# Patient Record
Sex: Female | Born: 1965 | Race: White | Hispanic: No | Marital: Married | Smoking: Never smoker
Health system: Southern US, Community
[De-identification: ages and names within clinical notes are randomized; demographics above are authoritative.]

## PROBLEM LIST (undated history)

## (undated) DIAGNOSIS — T7840XA Allergy, unspecified, initial encounter: Secondary | ICD-10-CM

## (undated) DIAGNOSIS — E785 Hyperlipidemia, unspecified: Secondary | ICD-10-CM

## (undated) DIAGNOSIS — M8580 Other specified disorders of bone density and structure, unspecified site: Secondary | ICD-10-CM

## (undated) HISTORY — PX: OOPHORECTOMY: SHX86

## (undated) HISTORY — DX: Hyperlipidemia, unspecified: E78.5

## (undated) HISTORY — DX: Allergy, unspecified, initial encounter: T78.40XA

## (undated) HISTORY — PX: CHOLECYSTECTOMY: SHX55

---

## 2004-08-18 ENCOUNTER — Emergency Department: Payer: Self-pay | Admitting: Internal Medicine

## 2006-01-17 ENCOUNTER — Emergency Department: Payer: Self-pay | Admitting: Emergency Medicine

## 2006-12-30 ENCOUNTER — Inpatient Hospital Stay: Payer: Self-pay | Admitting: Unknown Physician Specialty

## 2006-12-30 HISTORY — PX: ABDOMINAL HYSTERECTOMY: SHX81

## 2007-01-16 ENCOUNTER — Inpatient Hospital Stay: Payer: Self-pay

## 2007-02-04 ENCOUNTER — Ambulatory Visit: Payer: Self-pay | Admitting: Gastroenterology

## 2007-02-12 ENCOUNTER — Ambulatory Visit: Payer: Self-pay | Admitting: Unknown Physician Specialty

## 2007-04-13 ENCOUNTER — Emergency Department: Payer: Self-pay

## 2008-06-14 ENCOUNTER — Ambulatory Visit: Payer: Self-pay | Admitting: Anesthesiology

## 2010-02-27 ENCOUNTER — Ambulatory Visit: Payer: Self-pay | Admitting: Anesthesiology

## 2010-03-25 ENCOUNTER — Emergency Department: Payer: Self-pay | Admitting: Unknown Physician Specialty

## 2010-11-28 ENCOUNTER — Ambulatory Visit: Payer: Self-pay | Admitting: Anesthesiology

## 2011-07-18 ENCOUNTER — Ambulatory Visit: Payer: Self-pay | Admitting: Family Medicine

## 2012-02-19 ENCOUNTER — Ambulatory Visit: Payer: Self-pay | Admitting: Family Medicine

## 2012-09-15 ENCOUNTER — Ambulatory Visit: Payer: Self-pay | Admitting: Family Medicine

## 2013-05-13 ENCOUNTER — Ambulatory Visit: Payer: Self-pay | Admitting: Family Medicine

## 2014-11-08 ENCOUNTER — Encounter: Payer: Self-pay | Admitting: Family Medicine

## 2014-11-08 ENCOUNTER — Ambulatory Visit (INDEPENDENT_AMBULATORY_CARE_PROVIDER_SITE_OTHER): Payer: BLUE CROSS/BLUE SHIELD | Admitting: Family Medicine

## 2014-11-08 VITALS — BP 122/73 | HR 76 | Temp 98.1°F | Resp 18 | Ht 64.0 in | Wt 164.4 lb

## 2014-11-08 DIAGNOSIS — M25511 Pain in right shoulder: Secondary | ICD-10-CM | POA: Insufficient documentation

## 2014-11-08 DIAGNOSIS — E785 Hyperlipidemia, unspecified: Secondary | ICD-10-CM

## 2014-11-08 DIAGNOSIS — Z833 Family history of diabetes mellitus: Secondary | ICD-10-CM

## 2014-11-08 DIAGNOSIS — E782 Mixed hyperlipidemia: Secondary | ICD-10-CM | POA: Insufficient documentation

## 2014-11-08 DIAGNOSIS — N951 Menopausal and female climacteric states: Secondary | ICD-10-CM | POA: Diagnosis not present

## 2014-11-08 MED ORDER — EST ESTROGENS-METHYLTEST HS 0.625-1.25 MG PO TABS: 1.0000 | ORAL_TABLET | Freq: Every day | ORAL | Status: DC

## 2014-11-08 MED ORDER — CRESTOR 10 MG PO TABS: 10.0000 mg | ORAL_TABLET | Freq: Every day | ORAL | Status: DC

## 2014-11-08 MED ORDER — NAPROXEN SODIUM 550 MG PO TABS: 550.0000 mg | ORAL_TABLET | Freq: Two times a day (BID) | ORAL | Status: DC

## 2014-11-08 NOTE — Progress Notes (Signed)
Name: Tina Dunlap   MRN: [id number]    DOB: 03-11-1966   Date:11/08/2014       Progress Note  Subjective  Chief Complaint  Chief Complaint  Patient presents with  . Medication Refill  . Hyperlipidemia    Hyperlipidemia This is a chronic problem. The problem is controlled. Recent lipid tests were reviewed and are normal. Pertinent negatives include no chest pain, leg pain, myalgias or shortness of breath. Current antihyperlipidemic treatment includes statins.  Shoulder Pain  The pain is present in the right shoulder. This is a recurrent (off and on  pain for many years with intermittent flare ups.) problem. The quality of the pain is described as burning. The pain is moderate. Associated symptoms include stiffness. Pertinent negatives include no joint swelling or numbness. The symptoms are aggravated by activity (Lifted heavy boxes yesterday, which led to a flare up. of shoulder pain.). She has tried NSAIDS for the symptoms. The treatment provided moderate relief.  Post-Menopausal Symptoms Pt. Has symptoms of hot flashes, irritability, decrease in libido. She had total hysterectomy in 2008 and is taking hormone replacement therapy since then. She is aware of the potential side effects of HRT.    Past Medical History  Diagnosis Date  . Allergy   . Hyperlipidemia     Past Surgical History  Procedure Laterality Date  . Abdominal hysterectomy  12/30/2006  . Cholecystectomy      Family History  Problem Relation Age of Onset  . Colon polyps Mother   . Diabetes Father   . Alcohol abuse Father     History   Social History  . Marital Status: Married    Spouse Name: N/A  . Number of Children: N/A  . Years of Education: N/A   Occupational History  . Not on file.   Social History Main Topics  . Smoking status: Never Smoker   . Smokeless tobacco: Never Used  . Alcohol Use: No  . Drug Use: No  . Sexual Activity: Not on file   Other Topics Concern  . Not on file   Social  History Narrative  . No narrative on file     Current outpatient prescriptions:  .  cetirizine (ZYRTEC) 10 MG tablet, Take by mouth., Disp: , Rfl:  .  Cholecalciferol (VITAMIN D3) 1000 UNITS CAPS, Take by mouth., Disp: , Rfl:  .  CRESTOR 10 MG tablet, Take 10 mg by mouth daily., Disp: , Rfl: 1 .  EST ESTROGENS-METHYLTEST HS 0.625-1.25 MG per tablet, Take 1 tablet by mouth daily., Disp: , Rfl: 1 .  Glucosamine-Chondroit-Vit C-Mn (GLUCOSAMINE CHONDR 1500 COMPLX) CAPS, Take by mouth., Disp: , Rfl:  .  magnesium gluconate (MAGONATE) 500 MG tablet, Take by mouth., Disp: , Rfl:  .  Multiple Vitamin (MULTI-VITAMINS) TABS, Take by mouth., Disp: , Rfl:   Allergies  Allergen Reactions  . Latex Other (See Comments) and Rash     Review of Systems  Respiratory: Negative for shortness of breath.   Cardiovascular: Negative for chest pain.  Musculoskeletal: Positive for stiffness. Negative for myalgias.  Neurological: Negative for numbness.  Psychiatric/Behavioral: Negative for depression. The patient is not nervous/anxious.       Objective  Filed Vitals:   11/08/14 1415  BP: 122/73  Pulse: 76  Temp: 98.1 F (36.7 C)  TempSrc: Oral  Resp: 18  Height: 5\' 4"  (1.626 m)  Weight: 164 lb 6.4 oz (74.571 kg)  SpO2: 97%    Physical Exam  Constitutional: She is oriented  to person, place, and time and well-developed, well-nourished, and in no distress.  HENT:  Head: Normocephalic and atraumatic.  Cardiovascular: Normal rate and regular rhythm.   Pulmonary/Chest: Effort normal and breath sounds normal.  Abdominal: Soft. Bowel sounds are normal.  Neurological: She is alert and oriented to person, place, and time.  Skin: Skin is warm and dry.  Psychiatric: Memory, affect and judgment normal.  Nursing note and vitals reviewed.    Assessment & Plan 1. Hyperlipidemia Continue present management. Recheck FLP and liver enzymes today. - CRESTOR 10 MG tablet; Take 1 tablet (10 mg total) by  mouth daily.  Dispense: 90 tablet; Refill: 0 - Lipid Profile - Comprehensive metabolic panel  2. Hot flash, menopausal Symptoms are stable and responsive to hormone replacement therapy. Patient is aware of all the side effects and potential complications of HRT and she has had none so far. Refills provided for 3 months and follow-up. - EST ESTROGENS-METHYLTEST HS 0.625-1.25 MG per tablet; Take 1 tablet by mouth daily. Take 1 tab po q day x 3 weeks followed by 1 week off.  Dispense: 90 tablet; Refill: 1  3. Family history of diabetes mellitus in father  - HgB A1c  4. Right shoulder pain Check x-ray of right shoulder to rule out any acute process. Symptoms otherwise suggestive of rotator cuff pathology. Follow-up after review of x-ray and completion of NSAID therapy in 2 weeks. - DG Shoulder Right; Future - naproxen sodium (ANAPROX DS) 550 MG tablet; Take 1 tablet (550 mg total) by mouth 2 (two) times daily with a meal.  Dispense: 20 tablet; Refill: 0   Harlo Fabela Asad A. [facility] 11/08/2014 2:26 PM

## 2014-11-10 LAB — LIPID PANEL
Chol/HDL Ratio: 3.8 ratio units (ref 0.0–4.4)
Cholesterol, Total: 145 mg/dL (ref 100–199)
HDL: 38 mg/dL — ABNORMAL LOW (ref >39)
LDL Calculated: 89 mg/dL (ref 0–99)
Triglycerides: 92 mg/dL (ref 0–149)
VLDL Cholesterol Cal: 18 mg/dL (ref 5–40)

## 2014-11-10 LAB — COMPREHENSIVE METABOLIC PANEL
ALK PHOS: 100 IU/L (ref 39–117)
ALT: 27 IU/L (ref 0–32)
AST: 18 IU/L (ref 0–40)
Albumin/Globulin Ratio: 2 (ref 1.1–2.5)
Albumin: 4.5 g/dL (ref 3.5–5.5)
BILIRUBIN TOTAL: 0.4 mg/dL (ref 0.0–1.2)
BUN/Creatinine Ratio: 32 — ABNORMAL HIGH (ref 9–23)
BUN: 21 mg/dL (ref 6–24)
CO2: 23 mmol/L (ref 18–29)
CREATININE: 0.66 mg/dL (ref 0.57–1.00)
Calcium: 9.3 mg/dL (ref 8.7–10.2)
Chloride: 104 mmol/L (ref 97–108)
GFR calc Af Amer: 118 mL/min/{1.73_m2} (ref >59)
GFR, EST NON AFRICAN AMERICAN: 103 mL/min/{1.73_m2} (ref >59)
Globulin, Total: 2.3 g/dL (ref 1.5–4.5)
Glucose: 107 mg/dL — ABNORMAL HIGH (ref 65–99)
Potassium: 5 mmol/L (ref 3.5–5.2)
Sodium: 141 mmol/L (ref 134–144)
TOTAL PROTEIN: 6.8 g/dL (ref 6.0–8.5)

## 2014-11-10 LAB — HEMOGLOBIN A1C
ESTIMATED AVERAGE GLUCOSE: 114 mg/dL
Hgb A1c MFr Bld: 5.6 % (ref 4.8–5.6)

## 2014-12-30 ENCOUNTER — Encounter: Payer: Self-pay | Admitting: Family Medicine

## 2015-05-11 ENCOUNTER — Encounter: Payer: Self-pay | Admitting: Family Medicine

## 2015-05-11 ENCOUNTER — Ambulatory Visit (INDEPENDENT_AMBULATORY_CARE_PROVIDER_SITE_OTHER): Payer: BLUE CROSS/BLUE SHIELD | Admitting: Family Medicine

## 2015-05-11 VITALS — BP 121/69 | HR 80 | Temp 98.2°F | Resp 19 | Ht 64.0 in | Wt 163.8 lb

## 2015-05-11 DIAGNOSIS — N951 Menopausal and female climacteric states: Secondary | ICD-10-CM

## 2015-05-11 DIAGNOSIS — R739 Hyperglycemia, unspecified: Secondary | ICD-10-CM

## 2015-05-11 DIAGNOSIS — E785 Hyperlipidemia, unspecified: Secondary | ICD-10-CM

## 2015-05-11 LAB — POCT GLYCOSYLATED HEMOGLOBIN (HGB A1C): Hemoglobin A1C: 5.3

## 2015-05-11 MED ORDER — EST ESTROGENS-METHYLTEST HS 0.625-1.25 MG PO TABS: 1.0000 | ORAL_TABLET | Freq: Every day | ORAL | Status: DC

## 2015-05-11 MED ORDER — CRESTOR 10 MG PO TABS: 10.0000 mg | ORAL_TABLET | Freq: Every day | ORAL | Status: DC

## 2015-05-11 NOTE — Progress Notes (Signed)
Name: Tina Dunlap   MRN: [id number]    DOB: March 08, 1966   Date:05/11/2015       Progress Note  Subjective  Chief Complaint  Chief Complaint  Patient presents with  . Follow-up    6 mo  . Medication Refill    crestor 10 mg / estrogens   . Hyperlipidemia    Hyperlipidemia This is a chronic problem. The problem is controlled. Recent lipid tests were reviewed and are normal. Pertinent negatives include no chest pain, leg pain, myalgias or shortness of breath. Current antihyperlipidemic treatment includes statins. The current treatment provides significant improvement of lipids. There are no compliance problems.    Vasomotor Symptoms Pt. Has history of post-menopausal symptoms after total hysterectomy in 2008 for fibroid cysts, endometriosis, and 'hardening of uterine lining'. She is on esterified estrogen therapy isnce then, is aware of the potential risks of HRT (including, but not limited to CVD, DVT, CVA etc).HRT also helps with mood and libido as well. patient requests continuation of therapy  Past Medical History  Diagnosis Date  . Allergy   . Hyperlipidemia     Past Surgical History  Procedure Laterality Date  . Abdominal hysterectomy  12/30/2006  . Cholecystectomy      Family History  Problem Relation Age of Onset  . Colon polyps Mother   . Diabetes Father   . Alcohol abuse Father     Social History   Social History  . Marital Status: Married    Spouse Name: N/A  . Number of Children: N/A  . Years of Education: N/A   Occupational History  . Not on file.   Social History Main Topics  . Smoking status: Never Smoker   . Smokeless tobacco: Never Used  . Alcohol Use: No  . Drug Use: No  . Sexual Activity: Not on file   Other Topics Concern  . Not on file   Social History Narrative     Current outpatient prescriptions:  .  cetirizine (ZYRTEC) 10 MG tablet, Take by mouth., Disp: , Rfl:  .  Cholecalciferol (VITAMIN D3) 1000 UNITS CAPS, Take by mouth., Disp:  , Rfl:  .  CRESTOR 10 MG tablet, Take 1 tablet (10 mg total) by mouth daily., Disp: 90 tablet, Rfl: 0 .  EST ESTROGENS-METHYLTEST HS 0.625-1.25 MG per tablet, Take 1 tablet by mouth daily. Take 1 tab po q day x 3 weeks followed by 1 week off., Disp: 90 tablet, Rfl: 1 .  Glucosamine-Chondroit-Vit C-Mn (GLUCOSAMINE CHONDR 1500 COMPLX) CAPS, Take by mouth., Disp: , Rfl:  .  Multiple Vitamin (MULTI-VITAMINS) TABS, Take by mouth., Disp: , Rfl:  .  naproxen sodium (ANAPROX DS) 550 MG tablet, Take 1 tablet (550 mg total) by mouth 2 (two) times daily with a meal., Disp: 20 tablet, Rfl: 0  Allergies  Allergen Reactions  . Latex Other (See Comments) and Rash     Review of Systems  Constitutional: Negative for fever, chills and weight loss.  Respiratory: Negative for shortness of breath.   Cardiovascular: Negative for chest pain.  Gastrointestinal: Negative for abdominal pain.  Musculoskeletal: Negative for myalgias.     Objective  Filed Vitals:   05/11/15 0808  BP: 121/69  Pulse: 80  Temp: 98.2 F (36.8 C)  TempSrc: Oral  Resp: 19  Height: 5\' 4"  (1.626 m)  Weight: 163 lb 12.8 oz (74.299 kg)  SpO2: 96%    Physical Exam  Constitutional: She is well-developed, well-nourished, and in no distress.  Cardiovascular: Normal  rate and regular rhythm.   Pulmonary/Chest: Effort normal and breath sounds normal.  Abdominal: Soft. Bowel sounds are normal.  Nursing note and vitals reviewed.    Assessment & Plan  1. Hot flash, menopausal  Advised to cycle through 3 weeks, followed by 1 week of. - EST ESTROGENS-METHYLTEST HS 0.625-1.25 MG tablet; Take 1 tablet by mouth daily. Take 1 tab po q day x 3 weeks followed by 1 week off.  Dispense: 90 tablet; Refill: 1  2. Hyperlipidemia  - CRESTOR 10 MG tablet; Take 1 tablet (10 mg total) by mouth daily.  Dispense: 90 tablet; Refill: 0 - Lipid Profile - Comprehensive Metabolic Panel (CMET)  3. Hyperglycemia  - POCT HgB A1C   Danitra Payano Asad A.  [facility] 05/11/2015 8:16 AM

## 2015-05-12 LAB — COMPREHENSIVE METABOLIC PANEL
ALBUMIN: 4.4 g/dL (ref 3.5–5.5)
ALT: 26 IU/L (ref 0–32)
AST: 20 IU/L (ref 0–40)
Albumin/Globulin Ratio: 1.7 (ref 1.1–2.5)
Alkaline Phosphatase: 100 IU/L (ref 39–117)
BUN/Creatinine Ratio: 26 — ABNORMAL HIGH (ref 9–23)
BUN: 18 mg/dL (ref 6–24)
Bilirubin Total: 0.5 mg/dL (ref 0.0–1.2)
CALCIUM: 9.6 mg/dL (ref 8.7–10.2)
CO2: 24 mmol/L (ref 18–29)
CREATININE: 0.7 mg/dL (ref 0.57–1.00)
Chloride: 105 mmol/L (ref 96–106)
GFR calc non Af Amer: 101 mL/min/{1.73_m2} (ref >59)
GFR, EST AFRICAN AMERICAN: 116 mL/min/{1.73_m2} (ref >59)
GLUCOSE: 97 mg/dL (ref 65–99)
Globulin, Total: 2.6 g/dL (ref 1.5–4.5)
Potassium: 4.4 mmol/L (ref 3.5–5.2)
Sodium: 145 mmol/L — ABNORMAL HIGH (ref 134–144)
TOTAL PROTEIN: 7 g/dL (ref 6.0–8.5)

## 2015-05-12 LAB — LIPID PANEL
Chol/HDL Ratio: 3.5 ratio units (ref 0.0–4.4)
Cholesterol, Total: 138 mg/dL (ref 100–199)
HDL: 40 mg/dL (ref >39)
LDL Calculated: 80 mg/dL (ref 0–99)
Triglycerides: 92 mg/dL (ref 0–149)
VLDL Cholesterol Cal: 18 mg/dL (ref 5–40)

## 2015-06-09 ENCOUNTER — Ambulatory Visit (INDEPENDENT_AMBULATORY_CARE_PROVIDER_SITE_OTHER): Payer: BLUE CROSS/BLUE SHIELD | Admitting: Family Medicine

## 2015-06-09 ENCOUNTER — Encounter: Payer: Self-pay | Admitting: Family Medicine

## 2015-06-09 VITALS — BP 122/69 | HR 72 | Temp 97.7°F | Resp 16 | Ht 64.0 in | Wt 164.6 lb

## 2015-06-09 DIAGNOSIS — Z1239 Encounter for other screening for malignant neoplasm of breast: Secondary | ICD-10-CM | POA: Diagnosis not present

## 2015-06-09 DIAGNOSIS — Z0000 Encounter for general adult medical examination without abnormal findings: Secondary | ICD-10-CM | POA: Diagnosis not present

## 2015-06-09 NOTE — Progress Notes (Signed)
Name: Tina Dunlap   MRN: [id number]    DOB: Nov 11, 1965   Date:06/09/2015       Progress Note  Subjective  Chief Complaint  Chief Complaint  Patient presents with  . Annual Exam    CPE    HPI  Pt. Is here for a Complete Physical Exam. He is doing well. Last colonoscopy was in 2008. Last mammogram was June 2014 Pt. Had a total hysterectomy for endometriosis, and fibroids.    Past Medical History  Diagnosis Date  . Allergy   . Hyperlipidemia     Past Surgical History  Procedure Laterality Date  . Abdominal hysterectomy  12/30/2006  . Cholecystectomy      Family History  Problem Relation Age of Onset  . Colon polyps Mother   . Diabetes Father   . Alcohol abuse Father     Social History   Social History  . Marital Status: Married    Spouse Name: N/A  . Number of Children: N/A  . Years of Education: N/A   Occupational History  . Not on file.   Social History Main Topics  . Smoking status: Never Smoker   . Smokeless tobacco: Never Used  . Alcohol Use: No  . Drug Use: No  . Sexual Activity: Not on file   Other Topics Concern  . Not on file   Social History Narrative     Current outpatient prescriptions:  .  cetirizine (ZYRTEC) 10 MG tablet, Take by mouth., Disp: , Rfl:  .  Cholecalciferol (VITAMIN D3) 1000 UNITS CAPS, Take by mouth., Disp: , Rfl:  .  CRESTOR 10 MG tablet, Take 1 tablet (10 mg total) by mouth daily., Disp: 90 tablet, Rfl: 0 .  EST ESTROGENS-METHYLTEST HS 0.625-1.25 MG tablet, Take 1 tablet by mouth daily. Take 1 tab po q day x 3 weeks followed by 1 week off., Disp: 90 tablet, Rfl: 1 .  Glucosamine-Chondroit-Vit C-Mn (GLUCOSAMINE CHONDR 1500 COMPLX) CAPS, Take by mouth., Disp: , Rfl:  .  Multiple Vitamin (MULTI-VITAMINS) TABS, Take by mouth., Disp: , Rfl:  .  naproxen sodium (ANAPROX DS) 550 MG tablet, Take 1 tablet (550 mg total) by mouth 2 (two) times daily with a meal., Disp: 20 tablet, Rfl: 0  Allergies  Allergen Reactions  . Latex  Other (See Comments) and Rash    Review of Systems  Constitutional: Negative for fever, chills, weight loss and malaise/fatigue.  HENT: Negative for congestion and ear pain.   Eyes: Negative for blurred vision.  Respiratory: Negative for cough and shortness of breath.   Cardiovascular: Negative for chest pain.  Gastrointestinal: Positive for heartburn (occasional heartburn.). Negative for nausea, vomiting, diarrhea, constipation, blood in stool and melena.  Genitourinary: Negative for dysuria and hematuria.  Musculoskeletal: Positive for back pain (occasional back pain). Negative for joint pain and neck pain.  Skin: Negative for rash.  Neurological: Negative for dizziness and headaches.  Psychiatric/Behavioral: Negative for depression. The patient is not nervous/anxious (occasional anxiety) and does not have insomnia (occasional).      Objective  Filed Vitals:   06/09/15 0918  BP: 122/69  Pulse: 72  Temp: 97.7 F (36.5 C)  TempSrc: Oral  Resp: 16  Height: 5\' 4"  (1.626 m)  Weight: 164 lb 9.6 oz (74.662 kg)  SpO2: 97%    Physical Exam  Constitutional: She is oriented to person, place, and time and well-developed, well-nourished, and in no distress.  HENT:  Head: Normocephalic and atraumatic. Head is without contusion.  Right Ear: Tympanic membrane and ear canal normal.  Left Ear: Tympanic membrane and ear canal normal.  Mouth/Throat: Oropharynx is clear and moist and mucous membranes are normal.  Cardiovascular: Normal rate and regular rhythm.   Pulmonary/Chest: Effort normal and breath sounds normal.  Abdominal: Soft. Bowel sounds are normal.  Musculoskeletal:       Thoracic back: Normal.       Lumbar back: Normal.  Neurological: She is alert and oriented to person, place, and time.  Skin: Skin is warm and dry.  Psychiatric: Mood, memory, affect and judgment normal.  Nursing note and vitals reviewed.     Assessment & Plan  1. Well woman exam without  gynecological exam Patient will follow up with gynecology for GYN exams  2. Screening for breast cancer  - MM Digital Screening; Future   Rhiannan Kievit Asad A. [facility] 06/09/2015 9:40 AM

## 2015-08-10 ENCOUNTER — Other Ambulatory Visit: Payer: Self-pay | Admitting: Family Medicine

## 2015-11-02 ENCOUNTER — Ambulatory Visit: Payer: BLUE CROSS/BLUE SHIELD | Admitting: Family Medicine

## 2015-11-14 ENCOUNTER — Encounter: Payer: Self-pay | Admitting: Family Medicine

## 2015-11-14 ENCOUNTER — Ambulatory Visit (INDEPENDENT_AMBULATORY_CARE_PROVIDER_SITE_OTHER): Payer: BLUE CROSS/BLUE SHIELD | Admitting: Family Medicine

## 2015-11-14 VITALS — BP 122/75 | HR 73 | Temp 98.4°F | Resp 16 | Ht 64.0 in | Wt 167.5 lb

## 2015-11-14 DIAGNOSIS — N951 Menopausal and female climacteric states: Secondary | ICD-10-CM | POA: Diagnosis not present

## 2015-11-14 DIAGNOSIS — M25572 Pain in left ankle and joints of left foot: Secondary | ICD-10-CM

## 2015-11-14 DIAGNOSIS — E785 Hyperlipidemia, unspecified: Secondary | ICD-10-CM

## 2015-11-14 MED ORDER — EST ESTROGENS-METHYLTEST HS 0.625-1.25 MG PO TABS: 1.0000 | ORAL_TABLET | Freq: Every day | ORAL | 1 refills | Status: DC

## 2015-11-14 MED ORDER — CRESTOR 10 MG PO TABS: 10.0000 mg | ORAL_TABLET | Freq: Every day | ORAL | 0 refills | Status: DC

## 2015-11-14 NOTE — Progress Notes (Signed)
Name: Tina Dunlap   MRN: [id number]    DOB: 1965/11/10   Date:11/14/2015       Progress Note  Subjective  Chief Complaint  Chief Complaint  Patient presents with  . Medication Refill    Hyperlipidemia  This is a chronic problem. The problem is controlled. Recent lipid tests were reviewed and are normal. Exacerbating diseases include obesity. Pertinent negatives include no leg pain, myalgias or shortness of breath. Current antihyperlipidemic treatment includes statins. There are no compliance problems.   Ankle Pain   There was no injury mechanism. The pain is present in the left ankle. The pain is at a severity of 0/10 (pain is recurrent, worse at end of the day). The pain has been fluctuating since onset. Pertinent negatives include no inability to bear weight, loss of motion, numbness or tingling. The symptoms are aggravated by weight bearing. She has tried NSAIDs, heat and elevation for the symptoms. The treatment provided mild relief.   Menopausal Symptoms: Pt. Presents for medication refill and follow up on vasomotor symptoms of menopause. She reports frequent hot flashes, mood swings, and generally irritable before she was started on HRT after hysterectomy  many years ago. She reports relief of vasomotor symptoms while on HRT. No side effects reported.    Past Medical History:  Diagnosis Date  . Allergy   . Hyperlipidemia     Past Surgical History:  Procedure Laterality Date  . ABDOMINAL HYSTERECTOMY  12/30/2006  . CHOLECYSTECTOMY      Family History  Problem Relation Age of Onset  . Colon polyps Mother   . Diabetes Father   . Alcohol abuse Father     Social History   Social History  . Marital status: Married    Spouse name: N/A  . Number of children: N/A  . Years of education: N/A   Occupational History  . Not on file.   Social History Main Topics  . Smoking status: Never Smoker  . Smokeless tobacco: Never Used  . Alcohol use No  . Drug use: No  . Sexual  activity: Yes   Other Topics Concern  . Not on file   Social History Narrative  . No narrative on file     Current Outpatient Prescriptions:  .  cetirizine (ZYRTEC) 10 MG tablet, Take by mouth., Disp: , Rfl:  .  CRESTOR 10 MG tablet, TAKE 1 TABLET (10 MG TOTAL) BY MOUTH DAILY., Disp: 90 tablet, Rfl: 0 .  EST ESTROGENS-METHYLTEST HS 0.625-1.25 MG tablet, Take 1 tablet by mouth daily. Take 1 tab po q day x 3 weeks followed by 1 week off., Disp: 90 tablet, Rfl: 1 .  Multiple Vitamin (MULTI-VITAMINS) TABS, Take by mouth., Disp: , Rfl:  .  naproxen sodium (ANAPROX DS) 550 MG tablet, Take 1 tablet (550 mg total) by mouth 2 (two) times daily with a meal., Disp: 20 tablet, Rfl: 0 .  Cholecalciferol (VITAMIN D3) 1000 UNITS CAPS, Take by mouth., Disp: , Rfl:  .  Glucosamine-Chondroit-Vit C-Mn (GLUCOSAMINE CHONDR 1500 COMPLX) CAPS, Take by mouth., Disp: , Rfl:   Allergies  Allergen Reactions  . Latex Other (See Comments) and Rash    Review of Systems  Respiratory: Negative for shortness of breath.   Musculoskeletal: Positive for joint pain. Negative for myalgias.  Neurological: Negative for tingling and numbness.    Objective  Vitals:   11/14/15 1522  BP: 122/75  Pulse: 73  Resp: 16  Temp: 98.4 F (36.9 C)  TempSrc: Oral  SpO2: 97%  Weight: 167 lb 8 oz (76 kg)  Height: 5\' 4"  (1.626 m)    Physical Exam  Constitutional: She is oriented to person, place, and time and well-developed, well-nourished, and in no distress.  HENT:  Head: Normocephalic and atraumatic.  Cardiovascular: Normal rate, regular rhythm and normal heart sounds.   No murmur heard. Pulmonary/Chest: Effort normal and breath sounds normal. She has no wheezes.  Abdominal: Soft. Bowel sounds are normal. There is no tenderness.  Musculoskeletal:       Left ankle: She exhibits normal range of motion, no swelling and no deformity. No tenderness.  Neurological: She is alert and oriented to person, place, and time.   Psychiatric: Mood, memory, affect and judgment normal.  Nursing note and vitals reviewed.   Assessment & Plan  1. Hot flash, menopausal On long-term HRT, aware of potential adverse effects associated with HRT. Refills provided - EST ESTROGENS-METHYLTEST HS 0.625-1.25 MG tablet; Take 1 tablet by mouth daily. Take 1 tab po q day x 3 weeks followed by 1 week off.  Dispense: 90 tablet; Refill: 1  2. Hyperlipidemia  - CRESTOR 10 MG tablet; Take 1 tablet (10 mg total) by mouth daily.  Dispense: 90 tablet; Refill: 0 - Lipid Profile - COMPLETE METABOLIC PANEL WITH GFR  3. Pain in left ankle  Likely from overuse versus poor body mechanics. Encouraged stretching, may take short-term NSAID therapy.  Scherrie Seneca Asad A. [facility] 11/14/2015 4:15 PM

## 2015-11-16 LAB — LIPID PANEL
CHOL/HDL RATIO: 3.5 ratio (ref <=5.0)
CHOLESTEROL: 125 mg/dL (ref 125–200)
HDL: 36 mg/dL — AB (ref >=46)
LDL CALC: 69 mg/dL (ref <130)
TRIGLYCERIDES: 99 mg/dL (ref <150)
VLDL: 20 mg/dL (ref <30)

## 2015-11-16 LAB — COMPLETE METABOLIC PANEL WITH GFR
ALT: 24 U/L (ref 6–29)
AST: 20 U/L (ref 10–35)
Albumin: 4.3 g/dL (ref 3.6–5.1)
Alkaline Phosphatase: 98 U/L (ref 33–130)
BUN: 22 mg/dL (ref 7–25)
CALCIUM: 9.3 mg/dL (ref 8.6–10.4)
CHLORIDE: 108 mmol/L (ref 98–110)
CO2: 26 mmol/L (ref 20–31)
Creat: 0.62 mg/dL (ref 0.50–1.05)
Glucose, Bld: 103 mg/dL — ABNORMAL HIGH (ref 65–99)
POTASSIUM: 4.7 mmol/L (ref 3.5–5.3)
Sodium: 142 mmol/L (ref 135–146)
Total Bilirubin: 0.5 mg/dL (ref 0.2–1.2)
Total Protein: 6.8 g/dL (ref 6.1–8.1)

## 2015-12-22 ENCOUNTER — Telehealth: Payer: Self-pay | Admitting: Family Medicine

## 2015-12-22 DIAGNOSIS — E785 Hyperlipidemia, unspecified: Secondary | ICD-10-CM

## 2015-12-22 MED ORDER — CRESTOR 10 MG PO TABS: 10.0000 mg | ORAL_TABLET | Freq: Every day | ORAL | 0 refills | Status: DC

## 2015-12-22 NOTE — Telephone Encounter (Signed)
Prescription for Crestor has been sent to patient's pharmacy

## 2016-05-16 ENCOUNTER — Encounter: Payer: Self-pay | Admitting: Family Medicine

## 2016-05-16 ENCOUNTER — Ambulatory Visit (INDEPENDENT_AMBULATORY_CARE_PROVIDER_SITE_OTHER): Payer: BLUE CROSS/BLUE SHIELD | Admitting: Family Medicine

## 2016-05-16 VITALS — BP 121/68 | HR 85 | Temp 98.0°F | Resp 17 | Ht 64.0 in | Wt 165.8 lb

## 2016-05-16 DIAGNOSIS — N951 Menopausal and female climacteric states: Secondary | ICD-10-CM

## 2016-05-16 DIAGNOSIS — Z23 Encounter for immunization: Secondary | ICD-10-CM | POA: Diagnosis not present

## 2016-05-16 DIAGNOSIS — E785 Hyperlipidemia, unspecified: Secondary | ICD-10-CM | POA: Diagnosis not present

## 2016-05-16 DIAGNOSIS — R739 Hyperglycemia, unspecified: Secondary | ICD-10-CM | POA: Diagnosis not present

## 2016-05-16 LAB — POCT GLYCOSYLATED HEMOGLOBIN (HGB A1C): HEMOGLOBIN A1C: 5.6

## 2016-05-16 MED ORDER — EST ESTROGENS-METHYLTEST HS 0.625-1.25 MG PO TABS: 1.0000 | ORAL_TABLET | Freq: Every day | ORAL | 1 refills | Status: DC

## 2016-05-16 NOTE — Progress Notes (Signed)
Name: Tina Dunlap   MRN: [id number]    DOB: [birth year]   Date:05/16/2016       Progress Note  Subjective  Chief Complaint  Chief Complaint  Patient presents with  . Follow-up    4 wks / cholesterol    Hyperlipidemia  This is a chronic problem. The problem is controlled. Recent lipid tests were reviewed and are normal. Pertinent negatives include no leg pain, myalgias or shortness of breath. She is currently on no antihyperlipidemic treatment.    Menopausal Symptoms: Pt. Presents for medication refill and follow up on vasomotor symptoms of menopause. She reports frequent hot flashes, mood swings, and generally irritable before she was started on HRT after hysterectomy  many years ago. She feels well, reports no side effects from the treatment.  Past Medical History:  Diagnosis Date  . Allergy   . Hyperlipidemia     Past Surgical History:  Procedure Laterality Date  . ABDOMINAL HYSTERECTOMY  12/30/2006  . CHOLECYSTECTOMY      Family History  Problem Relation Age of Onset  . Colon polyps Mother   . Diabetes Father   . Alcohol abuse Father     Social History   Social History  . Marital status: Married    Spouse name: N/A  . Number of children: N/A  . Years of education: N/A   Occupational History  . Not on file.   Social History Main Topics  . Smoking status: Never Smoker  . Smokeless tobacco: Never Used  . Alcohol use No  . Drug use: No  . Sexual activity: Yes   Other Topics Concern  . Not on file   Social History Narrative  . No narrative on file     Current Outpatient Prescriptions:  .  cetirizine (ZYRTEC) 10 MG tablet, Take by mouth., Disp: , Rfl:  .  EST ESTROGENS-METHYLTEST HS 0.625-1.25 MG tablet, Take 1 tablet by mouth daily. Take 1 tab po q day x 3 weeks followed by 1 week off., Disp: 90 tablet, Rfl: 1 .  Multiple Vitamin (MULTI-VITAMINS) TABS, Take by mouth., Disp: , Rfl:  .  naproxen sodium (ANAPROX DS) 550 MG tablet, Take 1 tablet (550 mg  total) by mouth 2 (two) times daily with a meal., Disp: 20 tablet, Rfl: 0  Allergies  Allergen Reactions  . Latex Other (See Comments) and Rash     Review of Systems  Respiratory: Negative for shortness of breath.   Musculoskeletal: Negative for myalgias.     Objective  Vitals:   05/16/16 1325  BP: 121/68  Pulse: 85  Resp: 17  Temp: 98 F (36.7 C)  TempSrc: Oral  SpO2: 97%  Weight: 165 lb 12.8 oz (75.2 kg)  Height: 5\' 4"  (1.626 m)    Physical Exam  Constitutional: She is oriented to person, place, and time and well-developed, well-nourished, and in no distress.  HENT:  Head: Normocephalic and atraumatic.  Cardiovascular: Normal rate, regular rhythm and normal heart sounds.   No murmur heard. Pulmonary/Chest: Effort normal and breath sounds normal. She has no wheezes.  Abdominal: Soft. Bowel sounds are normal. There is no tenderness.  Musculoskeletal: She exhibits no edema.  Neurological: She is alert and oriented to person, place, and time.  Nursing note and vitals reviewed.     Assessment & Plan  1. Hot flash, menopausal Continue to take HRT as prescribed with 3 weeks of treatment followed by 1 week off. No reported adverse effects and patient is aware of all  potential long-term side effects. She wishes to continue on present therapy. Refills provided and reassess in 6 month - EST ESTROGENS-METHYLTEST HS 0.625-1.25 MG tablet; Take 1 tablet by mouth daily. Take 1 tab po q day x 3 weeks followed by 1 week off.  Dispense: 90 tablet; Refill: 1  2. Hyperglycemia A1c is 5.6%, considered normal - POCT glycosylated hemoglobin (Hb A1C)  3. Dyslipidemia  - Lipid panel  4. Need for influenza vaccination  - Flu Vaccine QUAD 36+ mos PF IM (Fluarix & Fluzone Quad PF)   Tomma Ehinger Asad A. [facility] 05/16/2016 1:34 PM

## 2016-05-20 LAB — LIPID PANEL
Cholesterol: 260 mg/dL — ABNORMAL HIGH (ref <200)
HDL: 38 mg/dL — AB (ref >50)
LDL CALC: 190 mg/dL — AB (ref <100)
TRIGLYCERIDES: 158 mg/dL — AB (ref <150)
Total CHOL/HDL Ratio: 6.8 Ratio — ABNORMAL HIGH (ref <5.0)
VLDL: 32 mg/dL — AB (ref <30)

## 2016-05-27 ENCOUNTER — Ambulatory Visit (INDEPENDENT_AMBULATORY_CARE_PROVIDER_SITE_OTHER): Payer: BLUE CROSS/BLUE SHIELD | Admitting: Family Medicine

## 2016-05-27 ENCOUNTER — Encounter: Payer: Self-pay | Admitting: Family Medicine

## 2016-05-27 VITALS — BP 128/75 | HR 69 | Temp 97.9°F | Resp 16 | Ht 64.0 in | Wt 165.6 lb

## 2016-05-27 DIAGNOSIS — E782 Mixed hyperlipidemia: Secondary | ICD-10-CM | POA: Diagnosis not present

## 2016-05-27 MED ORDER — ROSUVASTATIN CALCIUM 10 MG PO TABS: 10.0000 mg | ORAL_TABLET | Freq: Every day | ORAL | 1 refills | Status: DC

## 2016-05-27 NOTE — Progress Notes (Signed)
Name: Tina Dunlap   MRN: [id number]    DOB: 05-29-1965   Date:05/27/2016       Progress Note  Subjective  Chief Complaint  Chief Complaint  Patient presents with  . Follow-up    Statin therapy    Hyperlipidemia  This is a chronic problem. The problem is uncontrolled. Recent lipid tests were reviewed and are high. She is currently on no antihyperlipidemic treatment (SHe was on Crestor 10 mg at bedtime but has not taken it in the last 6 months. It was too expensive and she did not fill it.). Risk factors for coronary artery disease include dyslipidemia and family history.    Past Medical History:  Diagnosis Date  . Allergy   . Hyperlipidemia     Past Surgical History:  Procedure Laterality Date  . ABDOMINAL HYSTERECTOMY  12/30/2006  . CHOLECYSTECTOMY      Family History  Problem Relation Age of Onset  . Colon polyps Mother   . Diabetes Father   . Alcohol abuse Father     Social History   Social History  . Marital status: Married    Spouse name: N/A  . Number of children: N/A  . Years of education: N/A   Occupational History  . Not on file.   Social History Main Topics  . Smoking status: Never Smoker  . Smokeless tobacco: Never Used  . Alcohol use No  . Drug use: No  . Sexual activity: Yes   Other Topics Concern  . Not on file   Social History Narrative  . No narrative on file     Current Outpatient Prescriptions:  .  cetirizine (ZYRTEC) 10 MG tablet, Take by mouth., Disp: , Rfl:  .  EST ESTROGENS-METHYLTEST HS 0.625-1.25 MG tablet, Take 1 tablet by mouth daily. Take 1 tab po q day x 3 weeks followed by 1 week off., Disp: 90 tablet, Rfl: 1 .  Multiple Vitamin (MULTI-VITAMINS) TABS, Take by mouth., Disp: , Rfl:  .  naproxen sodium (ANAPROX DS) 550 MG tablet, Take 1 tablet (550 mg total) by mouth 2 (two) times daily with a meal., Disp: 20 tablet, Rfl: 0  Allergies  Allergen Reactions  . Latex Other (See Comments) and Rash     ROS  Please see  history of present illness for complete description of ROS  Objective  Vitals:   05/27/16 1504  BP: 128/75  Pulse: 69  Resp: 16  Temp: 97.9 F (36.6 C)  TempSrc: Oral  SpO2: 97%  Weight: 165 lb 9.6 oz (75.1 kg)  Height: 5\' 4"  (1.626 m)    Physical Exam  Constitutional: She is oriented to person, place, and time and well-developed, well-nourished, and in no distress.  HENT:  Head: Normocephalic and atraumatic.  Cardiovascular: Normal rate, regular rhythm and normal heart sounds.   No murmur heard. Pulmonary/Chest: Effort normal and breath sounds normal. She has no wheezes.  Neurological: She is alert and oriented to person, place, and time.  Psychiatric: Mood, memory, affect and judgment normal.  Nursing note and vitals reviewed.    Recent Results (from the past 2160 hour(s))  POCT glycosylated hemoglobin (Hb A1C)     Status: None   Collection Time: 05/16/16  1:56 PM  Result Value Ref Range   Hemoglobin A1C 5.6   Lipid panel     Status: Abnormal   Collection Time: 05/20/16  8:55 AM  Result Value Ref Range   Cholesterol 260 (H) <200 mg/dL   Triglycerides 158 (H) <  150 mg/dL   HDL 38 (L) >50 mg/dL   Total CHOL/HDL Ratio 6.8 (H) <5.0 Ratio   VLDL 32 (H) <30 mg/dL   LDL Cholesterol 190 (H) <100 mg/dL     Assessment & Plan  1. Mixed hyperlipidemia Patient has not taken rosuvastatin in the past few months, with consequent worsening of lipid profile. Restart on generic rosuvastatin 10 mg and recheck in 3 months. - rosuvastatin (CRESTOR) 10 MG tablet; Take 1 tablet (10 mg total) by mouth daily.  Dispense: 90 tablet; Refill: 1   Tina Dunlap [facility] 05/27/2016 3:40 PM

## 2016-08-26 ENCOUNTER — Encounter: Payer: Self-pay | Admitting: Family Medicine

## 2016-08-26 ENCOUNTER — Ambulatory Visit (INDEPENDENT_AMBULATORY_CARE_PROVIDER_SITE_OTHER): Payer: BLUE CROSS/BLUE SHIELD | Admitting: Family Medicine

## 2016-08-26 VITALS — BP 125/77 | HR 80 | Temp 97.5°F | Resp 16 | Ht 64.0 in | Wt 168.1 lb

## 2016-08-26 DIAGNOSIS — N951 Menopausal and female climacteric states: Secondary | ICD-10-CM

## 2016-08-26 DIAGNOSIS — E782 Mixed hyperlipidemia: Secondary | ICD-10-CM | POA: Diagnosis not present

## 2016-08-26 MED ORDER — EST ESTROGENS-METHYLTEST HS 0.625-1.25 MG PO TABS
1.0000 | ORAL_TABLET | Freq: Every day | ORAL | 1 refills | Status: DC
Start: 2016-08-26 — End: 2017-04-10

## 2016-08-26 NOTE — Progress Notes (Signed)
Name: Tina Dunlap   MRN: [id number]    DOB: 1965-10-05   Date:08/26/2016       Progress Note  Subjective  Chief Complaint  Chief Complaint  Patient presents with  . Follow-up    3 mo    Hyperlipidemia  This is a chronic problem. The problem is uncontrolled. Recent lipid tests were reviewed and are high. She has no history of obesity. Pertinent negatives include no focal weakness, leg pain or shortness of breath. Current antihyperlipidemic treatment includes statins.   Patient takes HRT for hot flashes, night sweats and irritability, feels that the HRT helps with symptoms relief, no side effects reported. She is aware of potential adverse effects.     Past Medical History:  Diagnosis Date  . Allergy   . Hyperlipidemia     Past Surgical History:  Procedure Laterality Date  . ABDOMINAL HYSTERECTOMY  12/30/2006  . CHOLECYSTECTOMY      Family History  Problem Relation Age of Onset  . Colon polyps Mother   . Diabetes Father   . Alcohol abuse Father     Social History   Social History  . Marital status: Married    Spouse name: N/A  . Number of children: N/A  . Years of education: N/A   Occupational History  . Not on file.   Social History Main Topics  . Smoking status: Never Smoker  . Smokeless tobacco: Never Used  . Alcohol use No  . Drug use: No  . Sexual activity: Yes   Other Topics Concern  . Not on file   Social History Narrative  . No narrative on file     Current Outpatient Prescriptions:  .  cetirizine (ZYRTEC) 10 MG tablet, Take by mouth., Disp: , Rfl:  .  EST ESTROGENS-METHYLTEST HS 0.625-1.25 MG tablet, Take 1 tablet by mouth daily. Take 1 tab po q day x 3 weeks followed by 1 week off., Disp: 90 tablet, Rfl: 1 .  Multiple Vitamin (MULTI-VITAMINS) TABS, Take by mouth., Disp: , Rfl:  .  rosuvastatin (CRESTOR) 10 MG tablet, Take 1 tablet (10 mg total) by mouth daily., Disp: 90 tablet, Rfl: 1 .  naproxen sodium (ANAPROX DS) 550 MG tablet, Take 1  tablet (550 mg total) by mouth 2 (two) times daily with a meal. (Patient not taking: Reported on 08/26/2016), Disp: 20 tablet, Rfl: 0  Allergies  Allergen Reactions  . Latex Other (See Comments) and Rash     Review of Systems  Respiratory: Negative for shortness of breath.   Neurological: Negative for focal weakness.      Objective  Vitals:   08/26/16 1448  BP: 125/77  Pulse: 80  Resp: 16  Temp: 97.5 F (36.4 C)  TempSrc: Oral  SpO2: 97%  Weight: 168 lb 1.6 oz (76.2 kg)  Height: 5\' 4"  (1.626 m)    Physical Exam  Constitutional: She is oriented to person, place, and time and well-developed, well-nourished, and in no distress.  HENT:  Head: Normocephalic and atraumatic.  Cardiovascular: Normal rate, regular rhythm and normal heart sounds.   No murmur heard. Pulmonary/Chest: Effort normal and breath sounds normal.  Abdominal: Soft. Bowel sounds are normal. There is no tenderness.  Musculoskeletal: She exhibits edema.       Right ankle: She exhibits swelling.       Left ankle: She exhibits swelling.  Trace bilateral pitting edema and tenderness to palpation over both lower extremities.  Neurological: She is alert and oriented to person, place,  and time.  Nursing note and vitals reviewed.      Assessment & Plan  1. Mixed hyperlipidemia Elevated FLP in February 2018, now back on statin, recheck levels - Lipid panel  2. Hot flash, menopausal Continue to take hormonal replacement therapy, patient aware of all potential cardiovascular and other adverse effects, appears to be doing well - EST ESTROGENS-METHYLTEST HS 0.625-1.25 MG tablet; Take 1 tablet by mouth daily. Take 1 tab po q day x 3 weeks followed by 1 week off.  Dispense: 90 tablet; Refill: 1   Kaiah Hosea Asad A[facility] 08/26/2016 3:10 PM

## 2016-08-28 LAB — LIPID PANEL
CHOLESTEROL: 141 mg/dL (ref <200)
HDL: 41 mg/dL — AB (ref >50)
LDL Cholesterol: 74 mg/dL (ref <100)
Total CHOL/HDL Ratio: 3.4 Ratio (ref <5.0)
Triglycerides: 129 mg/dL (ref <150)
VLDL: 26 mg/dL (ref <30)

## 2016-10-01 ENCOUNTER — Ambulatory Visit (INDEPENDENT_AMBULATORY_CARE_PROVIDER_SITE_OTHER): Payer: BLUE CROSS/BLUE SHIELD | Admitting: Family Medicine

## 2016-10-01 ENCOUNTER — Encounter: Payer: Self-pay | Admitting: Family Medicine

## 2016-10-01 VITALS — BP 126/70 | HR 76 | Temp 97.8°F | Resp 16 | Ht 64.0 in | Wt 169.1 lb

## 2016-10-01 DIAGNOSIS — Z1211 Encounter for screening for malignant neoplasm of colon: Secondary | ICD-10-CM | POA: Diagnosis not present

## 2016-10-01 DIAGNOSIS — Z0000 Encounter for general adult medical examination without abnormal findings: Secondary | ICD-10-CM

## 2016-10-01 DIAGNOSIS — Z1231 Encounter for screening mammogram for malignant neoplasm of breast: Secondary | ICD-10-CM | POA: Diagnosis not present

## 2016-10-01 DIAGNOSIS — Z1159 Encounter for screening for other viral diseases: Secondary | ICD-10-CM | POA: Diagnosis not present

## 2016-10-01 DIAGNOSIS — Z13820 Encounter for screening for osteoporosis: Secondary | ICD-10-CM | POA: Diagnosis not present

## 2016-10-01 DIAGNOSIS — Z1239 Encounter for other screening for malignant neoplasm of breast: Secondary | ICD-10-CM

## 2016-10-01 LAB — CBC WITH DIFFERENTIAL/PLATELET
BASOS ABS: 0 {cells}/uL (ref 0–200)
Basophils Relative: 0 %
EOS ABS: 208 {cells}/uL (ref 15–500)
Eosinophils Relative: 4 %
HCT: 43.4 % (ref 35.0–45.0)
Hemoglobin: 14.5 g/dL (ref 11.7–15.5)
LYMPHS PCT: 22 %
Lymphs Abs: 1144 cells/uL (ref 850–3900)
MCH: 30 pg (ref 27.0–33.0)
MCHC: 33.4 g/dL (ref 32.0–36.0)
MCV: 89.9 fL (ref 80.0–100.0)
MONOS PCT: 7 %
MPV: 10 fL (ref 7.5–12.5)
Monocytes Absolute: 364 cells/uL (ref 200–950)
NEUTROS PCT: 67 %
Neutro Abs: 3484 cells/uL (ref 1500–7800)
PLATELETS: 213 10*3/uL (ref 140–400)
RBC: 4.83 MIL/uL (ref 3.80–5.10)
RDW: 13 % (ref 11.0–15.0)
WBC: 5.2 10*3/uL (ref 3.8–10.8)

## 2016-10-01 LAB — HEPATITIS C ANTIBODY: HCV Ab: NEGATIVE

## 2016-10-01 LAB — TSH: TSH: 1.74 mIU/L

## 2016-10-01 NOTE — Progress Notes (Signed)
Name: Tina Dunlap   MRN: [id number]    DOB: [birth year]   Date:10/01/2016       Progress Note  Subjective  Chief Complaint  Chief Complaint  Patient presents with  . Annual Exam    CPE    HPI  Patint presents for Complete Physical Exam.  She is due for mammogram and DEXA scan. She had a colonoscopy a few years ago by Dr. Dionne Milo, no records available.  She is due for Hep C screening.    Past Medical History:  Diagnosis Date  . Allergy   . Hyperlipidemia     Past Surgical History:  Procedure Laterality Date  . ABDOMINAL HYSTERECTOMY  12/30/2006  . CHOLECYSTECTOMY      Family History  Problem Relation Age of Onset  . Colon polyps Mother   . Diabetes Father   . Alcohol abuse Father     Social History   Social History  . Marital status: Married    Spouse name: N/A  . Number of children: N/A  . Years of education: N/A   Occupational History  . Not on file.   Social History Main Topics  . Smoking status: Never Smoker  . Smokeless tobacco: Never Used  . Alcohol use No  . Drug use: No  . Sexual activity: Yes   Other Topics Concern  . Not on file   Social History Narrative  . No narrative on file     Current Outpatient Prescriptions:  .  cetirizine (ZYRTEC) 10 MG tablet, Take by mouth., Disp: , Rfl:  .  EST ESTROGENS-METHYLTEST HS 0.625-1.25 MG tablet, Take 1 tablet by mouth daily. Take 1 tab po q day x 3 weeks followed by 1 week off., Disp: 90 tablet, Rfl: 1 .  Multiple Vitamin (MULTI-VITAMINS) TABS, Take by mouth., Disp: , Rfl:  .  rosuvastatin (CRESTOR) 10 MG tablet, Take 1 tablet (10 mg total) by mouth daily., Disp: 90 tablet, Rfl: 1  Allergies  Allergen Reactions  . Latex Other (See Comments) and Rash     Review of Systems  Constitutional: Negative for chills, fever, malaise/fatigue and weight loss.  HENT: Negative for congestion and sore throat.   Eyes: Negative for blurred vision and double vision.  Respiratory: Negative for cough,  shortness of breath, wheezing and stridor.   Cardiovascular: Negative for chest pain, palpitations and leg swelling.  Gastrointestinal: Negative for abdominal pain, blood in stool, nausea and vomiting.  Genitourinary: Negative for dysuria and hematuria.  Musculoskeletal: Negative for back pain, joint pain and neck pain.  Neurological: Negative for dizziness and headaches.  Psychiatric/Behavioral: Negative for depression. The patient is not nervous/anxious and does not have insomnia.     Objective  Vitals:   10/01/16 1108  BP: 126/70  Pulse: 76  Resp: 16  Temp: 97.8 F (36.6 C)  TempSrc: Oral  SpO2: 97%  Weight: 169 lb 1.6 oz (76.7 kg)  Height: 5\' 4"  (1.626 m)    Physical Exam  Constitutional: She is oriented to person, place, and time and well-developed, well-nourished, and in no distress.  HENT:  Head: Normocephalic and atraumatic.  Right Ear: External ear normal.  Left Ear: External ear normal.  Mouth/Throat: Oropharynx is clear and moist.  Eyes: Pupils are equal, round, and reactive to light.  Cardiovascular: Normal rate, regular rhythm and normal heart sounds.   No murmur heard. Pulmonary/Chest: Effort normal and breath sounds normal. She has no wheezes. Right breast exhibits no mass and no tenderness. Left breast exhibits  no mass and no tenderness.  Abdominal: Soft. Bowel sounds are normal. There is no tenderness.  Musculoskeletal: She exhibits no edema.  Neurological: She is alert and oriented to person, place, and time.  Psychiatric: Mood, memory, affect and judgment normal.  Nursing note and vitals reviewed.     Assessment & Plan  1. Well woman exam without gynecological exam  - CBC with Differential/Platelet - TSH - VITAMIN D 25 Hydroxy (Vit-D Deficiency, Fractures)  2. Screening for breast cancer  - MM DIGITAL SCREENING BILATERAL; Future  3. Screening for colon cancer  - Cologuard  4. Screening for osteoporosis  - DG Bone Density; Future  5.  Need for hepatitis C screening test  - Hepatitis C antibody   Terisha Losasso Asad A. [facility] 10/01/2016 11:20 AM

## 2016-10-02 LAB — VITAMIN D 25 HYDROXY (VIT D DEFICIENCY, FRACTURES): Vit D, 25-Hydroxy: 36 ng/mL (ref 30–100)

## 2016-10-14 LAB — COLOGUARD: Cologuard: NEGATIVE

## 2016-11-13 ENCOUNTER — Ambulatory Visit: Payer: BLUE CROSS/BLUE SHIELD | Admitting: Family Medicine

## 2016-11-17 ENCOUNTER — Encounter: Payer: Self-pay | Admitting: Family Medicine

## 2016-11-17 ENCOUNTER — Other Ambulatory Visit: Payer: Self-pay | Admitting: Family Medicine

## 2016-11-17 DIAGNOSIS — E782 Mixed hyperlipidemia: Secondary | ICD-10-CM

## 2017-01-02 ENCOUNTER — Ambulatory Visit (INDEPENDENT_AMBULATORY_CARE_PROVIDER_SITE_OTHER): Payer: BLUE CROSS/BLUE SHIELD | Admitting: Family Medicine

## 2017-01-02 ENCOUNTER — Encounter: Payer: Self-pay | Admitting: Family Medicine

## 2017-01-02 VITALS — BP 114/62 | HR 81 | Temp 98.1°F | Resp 14 | Ht 64.0 in | Wt 170.8 lb

## 2017-01-02 DIAGNOSIS — E782 Mixed hyperlipidemia: Secondary | ICD-10-CM | POA: Diagnosis not present

## 2017-01-02 DIAGNOSIS — Z23 Encounter for immunization: Secondary | ICD-10-CM

## 2017-01-02 MED ORDER — ROSUVASTATIN CALCIUM 10 MG PO TABS: 10.0000 mg | ORAL_TABLET | Freq: Every day | ORAL | 0 refills | Status: DC

## 2017-01-02 NOTE — Progress Notes (Signed)
Name: Tina Dunlap   MRN: [id number]    DOB: Nov 20, 1965   Date:01/02/2017       Progress Note  Subjective  Chief Complaint  Chief Complaint  Patient presents with  . Medication Refill    crestor   . Immunizations    Tdap and flu  . Follow-up     3 months     Hyperlipidemia  This is a chronic problem. The problem is controlled. Recent lipid tests were reviewed and are normal. Pertinent negatives include no leg pain, myalgias or shortness of breath. Current antihyperlipidemic treatment includes statins. Risk factors for coronary artery disease include dyslipidemia.     Past Medical History:  Diagnosis Date  . Allergy   . Hyperlipidemia     Past Surgical History:  Procedure Laterality Date  . ABDOMINAL HYSTERECTOMY  12/30/2006  . CHOLECYSTECTOMY      Family History  Problem Relation Age of Onset  . Colon polyps Mother   . Diabetes Father   . Alcohol abuse Father     Social History   Social History  . Marital status: Married    Spouse name: N/A  . Number of children: N/A  . Years of education: N/A   Occupational History  . Not on file.   Social History Main Topics  . Smoking status: Never Smoker  . Smokeless tobacco: Never Used  . Alcohol use No  . Drug use: No  . Sexual activity: Yes   Other Topics Concern  . Not on file   Social History Narrative  . No narrative on file     Current Outpatient Prescriptions:  .  cetirizine (ZYRTEC) 10 MG tablet, Take by mouth., Disp: , Rfl:  .  EST ESTROGENS-METHYLTEST HS 0.625-1.25 MG tablet, Take 1 tablet by mouth daily. Take 1 tab po q day x 3 weeks followed by 1 week off., Disp: 90 tablet, Rfl: 1 .  Multiple Vitamin (MULTI-VITAMINS) TABS, Take by mouth., Disp: , Rfl:  .  rosuvastatin (CRESTOR) 10 MG tablet, TAKE 1 TABLET (10 MG TOTAL) BY MOUTH DAILY., Disp: 90 tablet, Rfl: 0  Allergies  Allergen Reactions  . Latex Other (See Comments) and Rash     Review of Systems  Respiratory: Negative for shortness of  breath.   Musculoskeletal: Negative for myalgias.     Objective  Vitals:   01/02/17 1022  BP: 114/62  Pulse: 81  Resp: 14  Temp: 98.1 F (36.7 C)  TempSrc: Oral  SpO2: 95%  Weight: 170 lb 12.8 oz (77.5 kg)  Height: 5\' 4"  (1.626 m)    Physical Exam  Constitutional: She is oriented to person, place, and time and well-developed, well-nourished, and in no distress.  HENT:  Head: Normocephalic and atraumatic.  Cardiovascular: Normal rate, regular rhythm and normal heart sounds.   No murmur heard. Pulmonary/Chest: Effort normal and breath sounds normal. She has no wheezes.  Neurological: She is alert and oriented to person, place, and time.  Psychiatric: Mood, memory, affect and judgment normal.  Nursing note and vitals reviewed.     Recent Results (from the past 2160 hour(s))  Cologuard     Status: None   Collection Time: 10/08/16 12:00 AM  Result Value Ref Range   Cologuard Negative      Assessment & Plan  1. Needs flu shot  - Flu Vaccine QUAD 6+ mos PF IM (Fluarix Quad PF)  2. Mixed hyperlipidemia FLP at goal from May 2018, refill for rosuvastatin provided - rosuvastatin (CRESTOR) 10  MG tablet; Take 1 tablet (10 mg total) by mouth daily.  Dispense: 90 tablet; Refill: 0    Rowene Suto Asad A. [facility] 01/02/2017 10:59 AM

## 2017-02-17 ENCOUNTER — Other Ambulatory Visit: Payer: Self-pay | Admitting: Family Medicine

## 2017-02-17 DIAGNOSIS — E782 Mixed hyperlipidemia: Secondary | ICD-10-CM

## 2017-04-10 ENCOUNTER — Ambulatory Visit (INDEPENDENT_AMBULATORY_CARE_PROVIDER_SITE_OTHER): Payer: BLUE CROSS/BLUE SHIELD | Admitting: Family Medicine

## 2017-04-10 ENCOUNTER — Encounter: Payer: Self-pay | Admitting: Family Medicine

## 2017-04-10 VITALS — BP 116/72 | HR 63 | Temp 97.9°F | Resp 14 | Ht 64.0 in | Wt 173.3 lb

## 2017-04-10 DIAGNOSIS — R0982 Postnasal drip: Secondary | ICD-10-CM

## 2017-04-10 DIAGNOSIS — E782 Mixed hyperlipidemia: Secondary | ICD-10-CM | POA: Diagnosis not present

## 2017-04-10 DIAGNOSIS — N951 Menopausal and female climacteric states: Secondary | ICD-10-CM

## 2017-04-10 LAB — LIPID PANEL
CHOL/HDL RATIO: 3.7 (calc) (ref <5.0)
Cholesterol: 136 mg/dL (ref <200)
HDL: 37 mg/dL — ABNORMAL LOW (ref >50)
LDL CHOLESTEROL (CALC): 79 mg/dL
Non-HDL Cholesterol (Calc): 99 mg/dL (calc) (ref <130)
TRIGLYCERIDES: 113 mg/dL (ref <150)

## 2017-04-10 MED ORDER — EST ESTROGENS-METHYLTEST HS 0.625-1.25 MG PO TABS: 1.0000 | ORAL_TABLET | Freq: Every day | ORAL | 1 refills | Status: DC

## 2017-04-10 MED ORDER — ROSUVASTATIN CALCIUM 10 MG PO TABS: 10.0000 mg | ORAL_TABLET | Freq: Every day | ORAL | 0 refills | Status: DC

## 2017-04-10 MED ORDER — FLUTICASONE PROPIONATE 50 MCG/ACT NA SUSP: 2.0000 | Freq: Every day | NASAL | 0 refills | Status: DC

## 2017-04-10 NOTE — Progress Notes (Signed)
Name: Tina Dunlap   MRN: [id number]    DOB: 02/27/1966   Date:04/10/2017       Progress Note  Subjective  Chief Complaint  Chief Complaint  Patient presents with  . Hyperlipidemia  . Diarrhea    last episode was 45 minutes ago; started hour a half ago. Pt denies any other symptoms  . Nasal Congestion    running nose   . head pressure    Some in the middle of her head  . Medication Refill    Hyperlipidemia  This is a chronic problem. The problem is controlled. Recent lipid tests were reviewed and are normal. Pertinent negatives include no leg pain or shortness of breath. Current antihyperlipidemic treatment includes statins.  Sinus Problem  This is a new problem. The current episode started in the past 7 days. There has been no fever. Associated symptoms include congestion. Pertinent negatives include no ear pain, headaches, shortness of breath, sinus pressure or sore throat (drainage in the throat in the morning, then gets better throughout the day). Past treatments include oral decongestants (Mucinex).   Pt. Takes HRT for menopausal symptoms, including sweating, feeling flushed, night sweats in the past and anxiety. She takes HRT one tablet daily for 3 weeks followed by one week off. She has no history of cardiopulmonary or liver disease, otherwise doing well.   Past Medical History:  Diagnosis Date  . Allergy   . Hyperlipidemia     Past Surgical History:  Procedure Laterality Date  . ABDOMINAL HYSTERECTOMY  12/30/2006  . CHOLECYSTECTOMY      Family History  Problem Relation Age of Onset  . Colon polyps Mother   . Diabetes Father   . Alcohol abuse Father     Social History   Socioeconomic History  . Marital status: Married    Spouse name: Not on file  . Number of children: Not on file  . Years of education: Not on file  . Highest education level: Not on file  Social Needs  . Financial resource strain: Not on file  . Food insecurity - worry: Not on file  . Food  insecurity - inability: Not on file  . Transportation needs - medical: Not on file  . Transportation needs - non-medical: Not on file  Occupational History  . Not on file  Tobacco Use  . Smoking status: Never Smoker  . Smokeless tobacco: Never Used  Substance and Sexual Activity  . Alcohol use: No    Alcohol/week: 0.0 oz  . Drug use: No  . Sexual activity: Yes  Other Topics Concern  . Not on file  Social History Narrative  . Not on file     Current Outpatient Medications:  .  cetirizine (ZYRTEC) 10 MG tablet, Take by mouth., Disp: , Rfl:  .  EST ESTROGENS-METHYLTEST HS 0.625-1.25 MG tablet, Take 1 tablet by mouth daily. Take 1 tab po q day x 3 weeks followed by 1 week off., Disp: 90 tablet, Rfl: 1 .  Multiple Vitamin (MULTI-VITAMINS) TABS, Take by mouth., Disp: , Rfl:  .  rosuvastatin (CRESTOR) 10 MG tablet, Take 1 tablet (10 mg total) by mouth daily., Disp: 90 tablet, Rfl: 0  Allergies  Allergen Reactions  . Latex Other (See Comments) and Rash     Review of Systems  HENT: Positive for congestion. Negative for ear pain, sinus pressure and sore throat (drainage in the throat in the morning, then gets better throughout the day).   Respiratory: Negative for shortness  of breath.   Neurological: Negative for headaches.      Objective  Vitals:   04/10/17 1006  BP: 116/72  Pulse: 63  Resp: 14  Temp: 97.9 F (36.6 C)  TempSrc: Oral  SpO2: 94%  Weight: 173 lb 4.8 oz (78.6 kg)  Height: 5\' 4"  (1.626 m)    Physical Exam  Constitutional: She is oriented to person, place, and time and well-developed, well-nourished, and in no distress.  HENT:  Head: Normocephalic and atraumatic.  Right Ear: Tympanic membrane and ear canal normal.  Left Ear: Tympanic membrane and ear canal normal.  Nose: Right sinus exhibits maxillary sinus tenderness. Left sinus exhibits maxillary sinus tenderness.  Mouth/Throat: Posterior oropharyngeal erythema present.  Nasal mucosal inflammation,  turbinates hypertrophied.   Cardiovascular: Normal rate, regular rhythm and normal heart sounds.  No murmur heard. Pulmonary/Chest: Effort normal and breath sounds normal. She has no wheezes.  Musculoskeletal: She exhibits no edema.  Neurological: She is alert and oriented to person, place, and time.  Psychiatric: Mood, memory, affect and judgment normal.  Nursing note and vitals reviewed.      Assessment & Plan  1. Hot flash, menopausal Continue on HRT, patient aware of risks with long-term HRT, does not wish to discontinue at this time, will discuss with next PCP - EST ESTROGENS-METHYLTEST HS 0.625-1.25 MG tablet; Take 1 tablet by mouth daily. Take 1 tab po q day x 3 weeks followed by 1 week off.  Dispense: 90 tablet; Refill: 1  2. Mixed hyperlipidemia  - rosuvastatin (CRESTOR) 10 MG tablet; Take 1 tablet (10 mg total) by mouth daily.  Dispense: 90 tablet; Refill: 0 - Lipid panel  3. Post-nasal drainage Symptoms likely because of seasonal allergies, if she does not improve on Flonase, will consider antibiotic - fluticasone (FLONASE) 50 MCG/ACT nasal spray; Place 2 sprays into both nostrils daily.  Dispense: 16 g; Refill: 0  Keyshon Stein Asad A[facility] 04/10/2017 10:23 AM

## 2017-05-05 ENCOUNTER — Other Ambulatory Visit: Payer: Self-pay

## 2017-05-05 DIAGNOSIS — R0982 Postnasal drip: Secondary | ICD-10-CM

## 2017-05-05 MED ORDER — FLUTICASONE PROPIONATE 50 MCG/ACT NA SUSP: 2.0000 | Freq: Every day | NASAL | 0 refills | Status: DC

## 2017-05-17 ENCOUNTER — Other Ambulatory Visit: Payer: Self-pay | Admitting: Family Medicine

## 2017-05-17 DIAGNOSIS — E782 Mixed hyperlipidemia: Secondary | ICD-10-CM

## 2017-06-06 ENCOUNTER — Other Ambulatory Visit: Payer: Self-pay

## 2017-06-06 DIAGNOSIS — R0982 Postnasal drip: Secondary | ICD-10-CM

## 2017-06-06 MED ORDER — FLUTICASONE PROPIONATE 50 MCG/ACT NA SUSP: 2.0000 | Freq: Every day | NASAL | 10 refills | Status: DC

## 2017-07-10 ENCOUNTER — Ambulatory Visit: Payer: BLUE CROSS/BLUE SHIELD | Admitting: Family Medicine

## 2017-07-18 ENCOUNTER — Encounter: Payer: Self-pay | Admitting: Family Medicine

## 2017-07-18 ENCOUNTER — Ambulatory Visit (INDEPENDENT_AMBULATORY_CARE_PROVIDER_SITE_OTHER): Payer: BLUE CROSS/BLUE SHIELD | Admitting: Family Medicine

## 2017-07-18 VITALS — BP 102/60 | HR 81 | Resp 16 | Ht 64.0 in | Wt 174.4 lb

## 2017-07-18 DIAGNOSIS — R635 Abnormal weight gain: Secondary | ICD-10-CM | POA: Diagnosis not present

## 2017-07-18 DIAGNOSIS — Z1231 Encounter for screening mammogram for malignant neoplasm of breast: Secondary | ICD-10-CM | POA: Diagnosis not present

## 2017-07-18 DIAGNOSIS — E28319 Asymptomatic premature menopause: Secondary | ICD-10-CM | POA: Diagnosis not present

## 2017-07-18 DIAGNOSIS — Z5181 Encounter for therapeutic drug level monitoring: Secondary | ICD-10-CM | POA: Diagnosis not present

## 2017-07-18 DIAGNOSIS — E782 Mixed hyperlipidemia: Secondary | ICD-10-CM

## 2017-07-18 DIAGNOSIS — N951 Menopausal and female climacteric states: Secondary | ICD-10-CM | POA: Diagnosis not present

## 2017-07-18 DIAGNOSIS — R739 Hyperglycemia, unspecified: Secondary | ICD-10-CM

## 2017-07-18 DIAGNOSIS — Z1239 Encounter for other screening for malignant neoplasm of breast: Secondary | ICD-10-CM

## 2017-07-18 MED ORDER — ROSUVASTATIN CALCIUM 10 MG PO TABS: 10.0000 mg | ORAL_TABLET | Freq: Every day | ORAL | 1 refills | Status: DC

## 2017-07-18 MED ORDER — ESTROGENS CONJUGATED 0.625 MG PO TABS: 0.6250 mg | ORAL_TABLET | Freq: Every day | ORAL | 0 refills | Status: DC

## 2017-07-18 MED ORDER — VENLAFAXINE HCL ER 37.5 MG PO CP24: 37.5000 mg | ORAL_CAPSULE | Freq: Every day | ORAL | 3 refills | Status: DC

## 2017-07-18 NOTE — Assessment & Plan Note (Signed)
Order mammo 

## 2017-07-18 NOTE — Patient Instructions (Addendum)
Please do call to schedule your mammogram and DEXA; the number to schedule one at either [facility] or [facility] is [phone] Try to limit saturated fats in your diet (bologna, hot dogs, barbeque, cheeseburgers, hamburgers, steak, bacon, sausage, cheese, etc.) and get more fresh fruits, vegetables, and whole grains Start the effexor for hot flashes Check out the information at familydoctor.org entitled "Nutrition for Weight Loss: What You Need to Know about Fad Diets" Try to lose between 1-2 pounds per week by taking in fewer calories and burning off more calories You can succeed by limiting portions, limiting foods dense in calories and fat, becoming more active, and drinking 8 glasses of water a day (64 ounces) Don't skip meals, especially breakfast, as skipping meals may alter your metabolism Do not use over-the-counter weight loss pills or gimmicks that claim rapid weight loss A healthy BMI (or body mass index) is between 18.5 and 24.9 You can calculate your ideal BMI at the [place] website [employer]fr

## 2017-07-18 NOTE — Assessment & Plan Note (Signed)
Check A1c; weight loss encouraged 

## 2017-07-18 NOTE — Assessment & Plan Note (Signed)
Try effexor at low dose for hot flashes; mammogram needed and then will taper the HRT

## 2017-07-18 NOTE — Assessment & Plan Note (Signed)
Check liver and kidneys 

## 2017-07-18 NOTE — Assessment & Plan Note (Signed)
Will get DEXA

## 2017-07-18 NOTE — Progress Notes (Signed)
BP 102/60 (BP Location: Left Arm, Patient Position: Sitting, Cuff Size: Normal)   Pulse 81   Resp 16   Ht 5\' 4"  (1.626 m)   Wt 174 lb 6.4 oz (79.1 kg)   SpO2 98%   BMI 29.94 kg/m    Subjective:    Patient ID: Tina Dunlap, female    DOB: 05/30/1965, 52 y.o.   MRN: [id number]  HPI: Tina Dunlap is a 52 y.o. female  Chief Complaint  Patient presents with  . Follow-up    patient has no concerns    HPI Patient is new to me; previous provider left our practice Her last well woman exam was October 01, 2016  She has hot flashes and is post-menopausal; she had a hysterectomy years back, maybe 10 years; she is on HRT, but has not had a mammogram since 2013; she has mood and irritability and issues; no lumps or bumps at all in her breasts  She has high cholesterol; she takes crestor; she also has testosterone in her HRT Grandmother had a stroke No personal hx of heart attack or stroke Not really limiting her diet; might eat one egg a week; does eat cheese; drinks milk rarely, 2%; could switch to skim or almond milk HDL is low; no exercise; not interested; gaining weight  High glucose; father had diabetes, died as a result; brother has diabetes, hasn't spoken to him lately; had change his diet and 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; truck driver  DEXA scan overdue; being done for early menopause  Lab Results  Component Value Date   CHOL 136 04/10/2017   CHOL 141 08/26/2016   CHOL 260 (H) 05/20/2016   Lab Results  Component Value Date   HDL 37 (L) 04/10/2017   HDL 41 (L) 08/26/2016   HDL 38 (L) 05/20/2016   Lab Results  Component Value Date   LDLCALC 79 04/10/2017   LDLCALC 74 08/26/2016   LDLCALC 190 (H) 05/20/2016   Lab Results  Component Value Date   TRIG 113 04/10/2017   TRIG 129 08/26/2016   TRIG 158 (H) 05/20/2016   Lab Results  Component Value Date   CHOLHDL 3.7 04/10/2017   CHOLHDL 3.4 08/26/2016   CHOLHDL 6.8 (H) 05/20/2016   No results found for:  LDLDIRECT    Depression screen [facility] 2/9 04/10/2017 01/02/2017 10/01/2016 08/26/2016 05/27/2016  Decreased Interest 0 0 0 0 0  Down, Depressed, Hopeless 0 0 0 0 0  PHQ - 2 Score 0 0 0 0 0    Relevant past medical, surgical, family and social history reviewed Past Medical History:  Diagnosis Date  . Allergy   . Hyperlipidemia    Past Surgical History:  Procedure Laterality Date  . ABDOMINAL HYSTERECTOMY  12/30/2006  . CHOLECYSTECTOMY     Family History  Problem Relation Age of Onset  . Colon polyps Mother   . Diabetes Father   . Alcohol abuse Father    Social History   Tobacco Use  . Smoking status: Never Smoker  . Smokeless tobacco: Never Used  Substance Use Topics  . Alcohol use: No    Alcohol/week: 0.0 oz  . Drug use: No    Interim medical history since last visit reviewed. Allergies and medications reviewed  Review of Systems Per HPI unless specifically indicated above     Objective:    BP 102/60 (BP Location: Left Arm, Patient Position: Sitting, Cuff Size: Normal)   Pulse 81   Resp 16   Ht  5\' 4"  (1.626 m)   Wt 174 lb 6.4 oz (79.1 kg)   SpO2 98%   BMI 29.94 kg/m   Wt Readings from Last 3 Encounters:  07/18/17 174 lb 6.4 oz (79.1 kg)  04/10/17 173 lb 4.8 oz (78.6 kg)  01/02/17 170 lb 12.8 oz (77.5 kg)    Physical Exam  Constitutional: She appears well-developed and well-nourished.  HENT:  Mouth/Throat: Mucous membranes are normal.  Eyes: EOM are normal. No scleral icterus.  Cardiovascular: Normal rate and regular rhythm.  Pulmonary/Chest: Effort normal and breath sounds normal.  Musculoskeletal: She exhibits no edema.  Skin: She is not diaphoretic. No pallor.  Psychiatric: Her behavior is normal. Her affect is blunt.  Rather flat affect; good eye contact with examiner; very little spontaneous movement    Results for orders placed or performed in visit on 04/10/17  Lipid panel  Result Value Ref Range   Cholesterol 136 <200 mg/dL   HDL 37 (L) >50  mg/dL   Triglycerides 113 <150 mg/dL   LDL Cholesterol (Calc) 79 mg/dL (calc)   Total CHOL/HDL Ratio 3.7 <5.0 (calc)   Non-HDL Cholesterol (Calc) 99 <130 mg/dL (calc)      Assessment & Plan:   Problem List Items Addressed This Visit      Other   Screening for breast cancer    Order mammo      Relevant Orders   MM DIGITAL SCREENING BILATERAL   Medication monitoring encounter    Check liver and kidneys      Relevant Orders   COMPLETE METABOLIC PANEL WITH GFR   Hyperlipidemia    Check lipid panel; will try to limit diet; see AVS; continue crestor; avoid the testosterone      Relevant Medications   rosuvastatin (CRESTOR) 10 MG tablet   Other Relevant Orders   Lipid panel   Hyperglycemia    Check A1c; weight loss encouraged      Relevant Orders   Hemoglobin A1c   Hot flash, menopausal - Primary    Try effexor at low dose for hot flashes; mammogram needed and then will taper the HRT      Early onset menopause    Will get DEXA      Relevant Orders   DG Bone Density    Other Visit Diagnoses    Weight gain       Relevant Orders   TSH       Follow up plan: Return in about 2 months (around 10/01/2017) for complete physical.  An after-visit summary was printed and given to the patient at [facility].  Please see the patient instructions which may contain other information and recommendations beyond what is mentioned above in the assessment and plan.  Meds ordered this encounter  Medications  . rosuvastatin (CRESTOR) 10 MG tablet    Sig: Take 1 tablet (10 mg total) by mouth at bedtime.    Dispense:  90 tablet    Refill:  1  . venlafaxine XR (EFFEXOR XR) 37.5 MG 24 hr capsule    Sig: Take 1 capsule (37.5 mg total) by mouth daily with breakfast.    Dispense:  90 capsule    Refill:  3  . estrogens, conjugated, (PREMARIN) 0.625 MG tablet    Sig: Take 1 tablet (0.625 mg total) by mouth daily.    Dispense:  14 tablet    Refill:  0    Orders Placed This Encounter   Procedures  . DG Bone Density  . MM DIGITAL  SCREENING BILATERAL  . Hemoglobin A1c  . COMPLETE METABOLIC PANEL WITH GFR  . Lipid panel  . TSH

## 2017-07-18 NOTE — Assessment & Plan Note (Addendum)
Check lipid panel; will try to limit diet; see AVS; continue crestor; avoid the testosterone

## 2017-07-19 LAB — COMPLETE METABOLIC PANEL WITH GFR
AG Ratio: 1.7 (calc) (ref 1.0–2.5)
ALBUMIN MSPROF: 4.3 g/dL (ref 3.6–5.1)
ALKALINE PHOSPHATASE (APISO): 99 U/L (ref 33–130)
ALT: 23 U/L (ref 6–29)
AST: 18 U/L (ref 10–35)
BUN: 16 mg/dL (ref 7–25)
CO2: 29 mmol/L (ref 20–32)
CREATININE: 0.64 mg/dL (ref 0.50–1.05)
Calcium: 9.7 mg/dL (ref 8.6–10.4)
Chloride: 106 mmol/L (ref 98–110)
GFR, Est African American: 118 mL/min/{1.73_m2} (ref >=60)
GFR, Est Non African American: 102 mL/min/{1.73_m2} (ref >=60)
GLOBULIN: 2.6 g/dL (ref 1.9–3.7)
Glucose, Bld: 98 mg/dL (ref 65–139)
Potassium: 4.5 mmol/L (ref 3.5–5.3)
SODIUM: 141 mmol/L (ref 135–146)
Total Bilirubin: 0.5 mg/dL (ref 0.2–1.2)
Total Protein: 6.9 g/dL (ref 6.1–8.1)

## 2017-07-19 LAB — LIPID PANEL
CHOL/HDL RATIO: 3.4 (calc) (ref <5.0)
CHOLESTEROL: 131 mg/dL (ref <200)
HDL: 38 mg/dL — ABNORMAL LOW (ref >50)
LDL Cholesterol (Calc): 73 mg/dL (calc)
NON-HDL CHOLESTEROL (CALC): 93 mg/dL (ref <130)
Triglycerides: 115 mg/dL (ref <150)

## 2017-07-19 LAB — HEMOGLOBIN A1C
HEMOGLOBIN A1C: 5.5 %{Hb} (ref <5.7)
Mean Plasma Glucose: 111 (calc)
eAG (mmol/L): 6.2 (calc)

## 2017-07-19 LAB — TSH: TSH: 1.25 mIU/L

## 2017-08-19 ENCOUNTER — Ambulatory Visit
Admission: RE | Admit: 2017-08-19 | Discharge: 2017-08-19 | Disposition: A | Discharge status: Home / Self Care | Payer: BLUE CROSS/BLUE SHIELD | Source: Ambulatory Visit | Attending: Family Medicine | Admitting: Family Medicine

## 2017-08-19 ENCOUNTER — Encounter: Payer: Self-pay | Admitting: Family Medicine

## 2017-08-19 DIAGNOSIS — E28319 Asymptomatic premature menopause: Secondary | ICD-10-CM

## 2017-08-19 DIAGNOSIS — M8588 Other specified disorders of bone density and structure, other site: Secondary | ICD-10-CM | POA: Diagnosis not present

## 2017-08-19 DIAGNOSIS — M8580 Other specified disorders of bone density and structure, unspecified site: Secondary | ICD-10-CM | POA: Insufficient documentation

## 2017-08-19 DIAGNOSIS — Z1231 Encounter for screening mammogram for malignant neoplasm of breast: Secondary | ICD-10-CM | POA: Insufficient documentation

## 2017-08-19 DIAGNOSIS — Z1239 Encounter for other screening for malignant neoplasm of breast: Secondary | ICD-10-CM

## 2017-08-19 HISTORY — DX: Other specified disorders of bone density and structure, unspecified site: M85.80

## 2017-10-02 ENCOUNTER — Encounter: Payer: Self-pay | Admitting: Family Medicine

## 2017-10-02 ENCOUNTER — Ambulatory Visit (INDEPENDENT_AMBULATORY_CARE_PROVIDER_SITE_OTHER): Payer: BLUE CROSS/BLUE SHIELD | Admitting: Family Medicine

## 2017-10-02 DIAGNOSIS — Z0000 Encounter for general adult medical examination without abnormal findings: Secondary | ICD-10-CM

## 2017-10-02 NOTE — Assessment & Plan Note (Signed)
USPSTF grade A and B recommendations reviewed with patient; age-appropriate recommendations, preventive care, screening tests, etc discussed and encouraged; healthy living encouraged; see AVS for patient education given to patient Will request records from her pap smear and hysterectomy from 12 years ago, if available

## 2017-10-02 NOTE — Patient Instructions (Addendum)
FRONT STAFF:  Request records from the hospital regarding pap smears and hysterectomy and pathology reports from 2007 and 2008 and 2009, everything related to hysterectomy or abnormal pap smears or follow-up  Check out the information at [email or web address] entitled "Nutrition for Weight Loss: What You Need to Know about Fad Diets" Try to lose between 1-2 pounds per week by taking in fewer calories and burning off more calories You can succeed by limiting portions, limiting foods dense in calories and fat, becoming more active, and drinking 8 glasses of water a day (64 ounces) Don't skip meals, especially breakfast, as skipping meals may alter your metabolism Do not use over-the-counter weight loss pills or gimmicks that claim rapid weight loss A healthy BMI (or body mass index) is between 18.5 and 24.9 You can calculate your ideal BMI at the [place] website ClubMonetize.fr   Consider getting the new shingles vaccine called Shingrix; that is available for individuals 54 years of age and older, and is recommended even if you have had shingles in the past and/or already received the old shingles vaccine (Zostavax); it is a two-part series, and is available at many local pharmacies   Health Maintenance, Female Adopting a healthy lifestyle and getting preventive care can go a long way to promote health and wellness. Talk with your health care provider about what schedule of regular examinations is right for you. This is a good chance for you to check in with your provider about disease prevention and staying healthy. In between checkups, there are plenty of things you can do on your own. Experts have done a lot of research about which lifestyle changes and preventive measures are most likely to keep you healthy. Ask your health care provider for more information. Weight and diet Eat a healthy diet  Be sure to include plenty of vegetables, fruits, low-fat  dairy products, and lean protein.  Do not eat a lot of foods high in solid fats, added sugars, or salt.  Get regular exercise. This is one of the most important things you can do for your health. ? Most adults should exercise for at least 150 minutes each week. The exercise should increase your heart rate and make you sweat (moderate-intensity exercise). ? Most adults should also do strengthening exercises at least twice a week. This is in addition to the moderate-intensity exercise.  Maintain a healthy weight  Body mass index (BMI) is a measurement that can be used to identify possible weight problems. It estimates body fat based on height and weight. Your health care provider can help determine your BMI and help you achieve or maintain a healthy weight.  For females 105 years of age and older: ? A BMI below 18.5 is considered underweight. ? A BMI of 18.5 to 24.9 is normal. ? A BMI of 25 to 29.9 is considered overweight. ? A BMI of 30 and above is considered obese.  Watch levels of cholesterol and blood lipids  You should start having your blood tested for lipids and cholesterol at 54 years of age, then have this test every 5 years.  You may need to have your cholesterol levels checked more often if: ? Your lipid or cholesterol levels are high. ? You are older than 54 years of age. ? You are at high risk for heart disease.  Cancer screening Lung Cancer  Lung cancer screening is recommended for adults 54-66 years old who are at high risk for lung cancer because of a history of smoking.  A  yearly low-dose CT scan of the lungs is recommended for people who: ? Currently smoke. ? Have quit within the past 15 years. ? Have at least a 30-pack-year history of smoking. A pack year is smoking an average of one pack of cigarettes a day for 1 year.  Yearly screening should continue until it has been 15 years since you quit.  Yearly screening should stop if you develop a health problem that  would prevent you from having lung cancer treatment.  Breast Cancer  Practice breast self-awareness. This means understanding how your breasts normally appear and feel.  It also means doing regular breast self-exams. Let your health care provider know about any changes, no matter how small.  If you are in your 54 or 30s, you should have a clinical breast exam (CBE) by a health care provider every 1-3 years as part of a regular health exam.  If you are 54 or older, have a CBE every year. Also consider having a breast X-ray (mammogram) every year.  If you have a family history of breast cancer, talk to your health care provider about genetic screening.  If you are at high risk for breast cancer, talk to your health care provider about having an MRI and a mammogram every year.  Breast cancer gene (BRCA) assessment is recommended for women who have family members with BRCA-related cancers. BRCA-related cancers include: ? Breast. ? Ovarian. ? Tubal. ? Peritoneal cancers.  Results of the assessment will determine the need for genetic counseling and BRCA1 and BRCA2 testing.  Cervical Cancer Your health care provider may recommend that you be screened regularly for cancer of the pelvic organs (ovaries, uterus, and vagina). This screening involves a pelvic examination, including checking for microscopic changes to the surface of your cervix (Pap test). You may be encouraged to have this screening done every 3 years, beginning at age 54.  For women ages 54-65, health care providers may recommend pelvic exams and Pap testing every 3 years, or they may recommend the Pap and pelvic exam, combined with testing for human papilloma virus (HPV), every 5 years. Some types of HPV increase your risk of cervical cancer. Testing for HPV may also be done on women of any age with unclear Pap test results.  Other health care providers may not recommend any screening for nonpregnant women who are considered low  risk for pelvic cancer and who do not have symptoms. Ask your health care provider if a screening pelvic exam is right for you.  If you have had past treatment for cervical cancer or a condition that could lead to cancer, you need Pap tests and screening for cancer for at least 20 years after your treatment. If Pap tests have been discontinued, your risk factors (such as having a new sexual partner) need to be reassessed to determine if screening should resume. Some women have medical problems that increase the chance of getting cervical cancer. In these cases, your health care provider may recommend more frequent screening and Pap tests.  Colorectal Cancer  This type of cancer can be detected and often prevented.  Routine colorectal cancer screening usually begins at 54 years of age and continues through 54 years of age.  Your health care provider may recommend screening at an earlier age if you have risk factors for colon cancer.  Your health care provider may also recommend using home test kits to check for hidden blood in the stool.  A small camera at the end of  a tube can be used to examine your colon directly (sigmoidoscopy or colonoscopy). This is done to check for the earliest forms of colorectal cancer.  Routine screening usually begins at age 47.  Direct examination of the colon should be repeated every 5-10 years through 54 years of age. However, you may need to be screened more often if early forms of precancerous polyps or small growths are found.  Skin Cancer  Check your skin from head to toe regularly.  Tell your health care provider about any new moles or changes in moles, especially if there is a change in a mole's shape or color.  Also tell your health care provider if you have a mole that is larger than the size of a pencil eraser.  Always use sunscreen. Apply sunscreen liberally and repeatedly throughout the day.  Protect yourself by wearing long sleeves, pants, a  wide-brimmed hat, and sunglasses whenever you are outside.  Heart disease, diabetes, and high blood pressure  High blood pressure causes heart disease and increases the risk of stroke. High blood pressure is more likely to develop in: ? People who have blood pressure in the high end of the normal range (130-139/85-89 mm Hg). ? People who are overweight or obese. ? People who are African American.  If you are 39-69 years of age, have your blood pressure checked every 3-5 years. If you are 47 years of age or older, have your blood pressure checked every year. You should have your blood pressure measured twice-once when you are at a hospital or clinic, and once when you are not at a hospital or clinic. Record the average of the two measurements. To check your blood pressure when you are not at a hospital or clinic, you can use: ? An automated blood pressure machine at a pharmacy. ? A home blood pressure monitor.  If you are between 95 years and 63 years old, ask your health care provider if you should take aspirin to prevent strokes.  Have regular diabetes screenings. This involves taking a blood sample to check your fasting blood sugar level. ? If you are at a normal weight and have a low risk for diabetes, have this test once every three years after 54 years of age. ? If you are overweight and have a high risk for diabetes, consider being tested at a younger age or more often. Preventing infection Hepatitis B  If you have a higher risk for hepatitis B, you should be screened for this virus. You are considered at high risk for hepatitis B if: ? You were born in a country where hepatitis B is common. Ask your health care provider which countries are considered high risk. ? Your parents were born in a high-risk country, and you have not been immunized against hepatitis B (hepatitis B vaccine). ? You have HIV or AIDS. ? You use needles to inject street drugs. ? You live with someone who has  hepatitis B. ? You have had sex with someone who has hepatitis B. ? You get hemodialysis treatment. ? You take certain medicines for conditions, including cancer, organ transplantation, and autoimmune conditions.  Hepatitis C  Blood testing is recommended for: ? Everyone born from [birth year] through 1967. ? Anyone with known risk factors for hepatitis C.  Sexually transmitted infections (STIs)  You should be screened for sexually transmitted infections (STIs) including gonorrhea and chlamydia if: ? You are sexually active and are younger than 54 years of age. ? You are older  than 54 years of age and your health care provider tells you that you are at risk for this type of infection. ? Your sexual activity has changed since you were last screened and you are at an increased risk for chlamydia or gonorrhea. Ask your health care provider if you are at risk.  If you do not have HIV, but are at risk, it may be recommended that you take a prescription medicine daily to prevent HIV infection. This is called pre-exposure prophylaxis (PrEP). You are considered at risk if: ? You are sexually active and do not regularly use condoms or know the HIV status of your partner(s). ? You take drugs by injection. ? You are sexually active with a partner who has HIV.  Talk with your health care provider about whether you are at high risk of being infected with HIV. If you choose to begin PrEP, you should first be tested for HIV. You should then be tested every 3 months for as long as you are taking PrEP. Pregnancy  If you are premenopausal and you may become pregnant, ask your health care provider about preconception counseling.  If you may become pregnant, take 400 to 800 micrograms (mcg) of folic acid every day.  If you want to prevent pregnancy, talk to your health care provider about birth control (contraception). Osteoporosis and menopause  Osteoporosis is a disease in which the bones lose minerals and  strength with aging. This can result in serious bone fractures. Your risk for osteoporosis can be identified using a bone density scan.  If you are 15 years of age or older, or if you are at risk for osteoporosis and fractures, ask your health care provider if you should be screened.  Ask your health care provider whether you should take a calcium or vitamin D supplement to lower your risk for osteoporosis.  Menopause may have certain physical symptoms and risks.  Hormone replacement therapy may reduce some of these symptoms and risks. Talk to your health care provider about whether hormone replacement therapy is right for you. Follow these instructions at home:  Schedule regular health, dental, and eye exams.  Stay current with your immunizations.  Do not use any tobacco products including cigarettes, chewing tobacco, or electronic cigarettes.  If you are pregnant, do not drink alcohol.  If you are breastfeeding, limit how much and how often you drink alcohol.  Limit alcohol intake to no more than 1 drink per day for nonpregnant women. One drink equals 12 ounces of beer, 5 ounces of wine, or 1 ounces of hard liquor.  Do not use street drugs.  Do not share needles.  Ask your health care provider for help if you need support or information about quitting drugs.  Tell your health care provider if you often feel depressed.  Tell your health care provider if you have ever been abused or do not feel safe at home. This information is not intended to replace advice given to you by your health care provider. Make sure you discuss any questions you have with your health care provider. Document Released: 10/08/2010 Document Revised: 08/31/2015 Document Reviewed: 12/27/2014 Elsevier Interactive Patient Education  2018 [employer].  I'll calculate whether you need aspirin or not once labs are back Obesity, Adult Obesity is the condition of having too much total body fat. Being  overweight or obese means that your weight is greater than what is considered healthy for your body size. Obesity is determined by a measurement called  BMI. BMI is an estimate of body fat and is calculated from height and weight. For adults, a BMI of 30 or higher is considered obese. Obesity can eventually lead to other health concerns and major illnesses, including:  Stroke.  Coronary artery disease (CAD).  Type 2 diabetes.  Some types of cancer, including cancers of the colon, breast, uterus, and gallbladder.  Osteoarthritis.  High blood pressure (hypertension).  High cholesterol.  Sleep apnea.  Gallbladder stones.  Infertility problems.  What are the causes? The main cause of obesity is taking in (consuming) more calories than your body uses for energy. Other factors that contribute to this condition may include:  Being born with genes that make you more likely to become obese.  Having a medical condition that causes obesity. These conditions include: ? Hypothyroidism. ? Polycystic ovarian syndrome (PCOS). ? Binge-eating disorder. ? Cushing syndrome.  Taking certain medicines, such as steroids, antidepressants, and seizure medicines.  Not being physically active (sedentary lifestyle).  Living where there are limited places to exercise safely or buy healthy foods.  Not getting enough sleep.  What increases the risk? The following factors may increase your risk of this condition:  Having a family history of obesity.  Being a woman of African-American descent.  Being a man of Hispanic descent.  What are the signs or symptoms? Having excessive body fat is the main symptom of this condition. How is this diagnosed? This condition may be diagnosed based on:  Your symptoms.  Your medical history.  A physical exam. Your health care provider may measure: ? Your BMI. If you are an adult with a BMI between 25 and less than 30, you are considered overweight. If you  are an adult with a BMI of 30 or higher, you are considered obese. ? The distances around your hips and your waist (circumferences). These may be compared to each other to help diagnose your condition. ? Your skinfold thickness. Your health care provider may gently pinch a fold of your skin and measure it.  How is this treated? Treatment for this condition often includes changing your lifestyle. Treatment may include some or all of the following:  Dietary changes. Work with your health care provider and a dietitian to set a weight-loss goal that is healthy and reasonable for you. Dietary changes may include eating: ? Smaller portions. A portion size is the amount of a particular food that is healthy for you to eat at one time. This varies from person to person. ? Low-calorie or low-fat options. ? More whole grains, fruits, and vegetables.  Regular physical activity. This may include aerobic activity (cardio) and strength training.  Medicine to help you lose weight. Your health care provider may prescribe medicine if you are unable to lose 1 pound a week after 6 weeks of eating more healthily and doing more physical activity.  Surgery. Surgical options may include gastric banding and gastric bypass. Surgery may be done if: ? Other treatments have not helped to improve your condition. ? You have a BMI of 40 or higher. ? You have life-threatening health problems related to obesity.  Follow these instructions at home:  Eating and drinking   Follow recommendations from your health care provider about what you eat and drink. Your health care provider may advise you to: ? Limit fast foods, sweets, and processed snack foods. ? Choose low-fat options, such as low-fat milk instead of whole milk. ? Eat 5 or more servings of fruits or vegetables every day. ?  Eat at home more often. This gives you more control over what you eat. ? Choose healthy foods when you eat out. ? Learn what a healthy  portion size is. ? Keep low-fat snacks on hand. ? Avoid sugary drinks, such as soda, fruit juice, iced tea sweetened with sugar, and flavored milk. ? Eat a healthy breakfast.  Drink enough water to keep your urine clear or pale yellow.  Do not go without eating for long periods of time (do not fast) or follow a fad diet. Fasting and fad diets can be unhealthy and even dangerous. Physical Activity  Exercise regularly, as told by your health care provider. Ask your health care provider what types of exercise are safe for you and how often you should exercise.  Warm up and stretch before being active.  Cool down and stretch after being active.  Rest between periods of activity. Lifestyle  Limit the time that you spend in front of your TV, computer, or video game system.  Find ways to reward yourself that do not involve food.  Limit alcohol intake to no more than 1 drink a day for nonpregnant women and 2 drinks a day for men. One drink equals 12 oz of beer, 5 oz of wine, or 1 oz of hard liquor. General instructions  Keep a weight loss journal to keep track of the food you eat and how much you exercise you get.  Take over-the-counter and prescription medicines only as told by your health care provider.  Take vitamins and supplements only as told by your health care provider.  Consider joining a support group. Your health care provider may be able to recommend a support group.  Keep all follow-up visits as told by your health care provider. This is important. Contact a health care provider if:  You are unable to meet your weight loss goal after 6 weeks of dietary and lifestyle changes. This information is not intended to replace advice given to you by your health care provider. Make sure you discuss any questions you have with your health care provider. Document Released: 05/02/2004 Document Revised: 08/28/2015 Document Reviewed: 01/11/2015 Elsevier Interactive Patient Education   2018 [place].  Preventing Unhealthy [employer], Adult Staying at a healthy weight is important. When fat builds up in your body, you may become overweight or obese. These conditions put you at greater risk for developing certain health problems, such as heart disease, diabetes, sleeping problems, joint problems, and some cancers. Unhealthy weight gain is often the result of making unhealthy choices in what you eat. It is also a result of not getting enough exercise. You can make changes to your lifestyle to prevent obesity and stay as healthy as possible. What nutrition changes can be made? To maintain a healthy weight and prevent obesity:  Eat only as much as your body needs. To do this: ? Pay attention to signs that you are hungry or full. Stop eating as soon as you feel full. ? If you feel hungry, try drinking water first. Drink enough water so your urine is clear or pale yellow. ? Eat smaller portions. ? Look at serving sizes on food labels. Most foods contain more than one serving per container. ? Eat the recommended amount of calories for your gender and activity level. While most active people should eat around 2,000 calories per day, if you are trying to lose weight or are not very active, you main need to eat less calories. Talk to your health care  provider or dietitian about how many calories you should eat each day.  Choose healthy foods, such as: ? Fruits and vegetables. Try to fill at least half of your plate at each meal with fruits and vegetables. ? Whole grains, such as whole wheat bread, brown rice, and quinoa. ? Lean meats, such as chicken or fish. ? Other healthy proteins, such as beans, eggs, or tofu. ? Healthy fats, such as nuts, seeds, fatty fish, and olive oil. ? Low-fat or fat-free dairy.  Check food labels and avoid food and drinks that: ? Are high in calories. ? Have added sugar. ? Are high in sodium. ? Have saturated fats or trans fats.  Limit how much  you eat of the following foods: ? Prepackaged meals. ? Fast food. ? Fried foods. ? Processed meat, such as bacon, sausage, and deli meats. ? Fatty cuts of red meat and poultry with skin.  Cook foods in healthier ways, such as by baking, broiling, or grilling.  When grocery shopping, try to shop around the outside of the store. This helps you buy mostly fresh foods and avoid canned and prepackaged foods.  What lifestyle changes can be made?  Exercise at least 30 minutes 5 or more days each week. Exercising includes brisk walking, yard work, biking, running, swimming, and team sports like basketball and soccer. Ask your health care provider which exercises are safe for you.  Do not use any products that contain nicotine or tobacco, such as cigarettes and e-cigarettes. If you need help quitting, ask your health care provider.  Limit alcohol intake to no more than 1 drink a day for nonpregnant women and 2 drinks a day for men. One drink equals 12 oz of beer, 5 oz of wine, or 1 oz of hard liquor.  Try to get 7-9 hours of sleep each night. What other changes can be made?  Keep a food and activity journal to keep track of: ? What you ate and how many calories you had. Remember to count sauces, dressings, and side dishes. ? Whether you were active, and what exercises you did. ? Your calorie, weight, and activity goals.  Check your weight regularly. Track any changes. If you notice you have gained weight, make changes to your diet or activity routine.  Avoid taking weight-loss medicines or supplements. Talk to your health care provider before starting any new medicine or supplement.  Talk to your health care provider before trying any new diet or exercise plan. Why are these changes important? Eating healthy, staying active, and having healthy habits not only help prevent obesity, they also:  Help you to manage stress and emotions.  Help you to connect with friends and family.  Improve  your self-esteem.  Improve your sleep.  Prevent long-term health problems.  What can happen if changes are not made? Being obese or overweight can cause you to develop joint or bone problems, which can make it hard for you to stay active or do activities you enjoy. Being obese or overweight also puts stress on your heart and lungs and can lead to health problems like diabetes, heart disease, and some cancers. Where to find more information: Talk with your health care provider or a dietitian about healthy eating and healthy lifestyle choices. You may also find other information through these resources:  [place] Department of Agriculture MyPlate: FormerBoss.no  American Heart Association: www.heart.org  Centers for Disease Control and Prevention: http://www.wolf.info/  Summary  Staying at a healthy weight is important. It  helps prevent certain diseases and health problems, such as heart disease, diabetes, joint problems, sleep disorders, and some cancers.  Being obese or overweight can cause you to develop joint or bone problems, which can make it hard for you to stay active or do activities you enjoy.  You can prevent unhealthy weight gain by eating a healthy diet, exercising regularly, not smoking, limiting alcohol, and getting enough sleep.  Talk with your health care provider or a dietitian for guidance about healthy eating and healthy lifestyle choices. This information is not intended to replace advice given to you by your health care provider. Make sure you discuss any questions you have with your health care provider. Document Released: 03/26/2016 Document Revised: 05/01/2016 Document Reviewed: 05/01/2016 Elsevier Interactive Patient Education  [employer].

## 2017-10-02 NOTE — Progress Notes (Signed)
Patient ID: Tina Dunlap, female   DOB: [birth year], 52 y.o.   MRN: [id number]   Subjective:   Tina Dunlap is a 52 y.o. female here for a complete physical exam  Interim issues since last visit: off of HRT; not using flonase  Still taking crestor and venlafaxine  USPSTF grade A and B recommendations Depression:  Depression screen [facility] 2/9 10/02/2017 04/10/2017 01/02/2017 10/01/2016 08/26/2016  Decreased Interest 0 0 0 0 0  Down, Depressed, Hopeless 0 0 0 0 0  PHQ - 2 Score 0 0 0 0 0  Altered sleeping 0 - - - -  Tired, decreased energy 2 - - - -  Change in appetite 0 - - - -  Feeling bad or failure about yourself  0 - - - -  Trouble concentrating 3 - - - -  Moving slowly or fidgety/restless 0 - - - -  Suicidal thoughts 0 - - - -  PHQ-9 Score 5 - - - -  Difficult doing work/chores Not difficult at all - - - -   Hypertension: BP Readings from Last 3 Encounters:  10/02/17 122/80  07/18/17 102/60  04/10/17 116/72   Obesity: Wt Readings from Last 3 Encounters:  10/02/17 176 lb 8 oz (80.1 kg)  07/18/17 174 lb 6.4 oz (79.1 kg)  04/10/17 173 lb 4.8 oz (78.6 kg)   BMI Readings from Last 3 Encounters:  10/02/17 30.30 kg/m  07/18/17 29.94 kg/m  04/10/17 29.75 kg/m    Skin cancer: has an open comedone on the upper left chest; otherwise no worrisome moles Lung cancer:  nonsmoker Breast cancer: just had mammogram in May and politely declined CBE Colorectal cancer: just had cologuard in 2018; due next in 2021 Cervical cancer screening: s/p hyst BRCA gene screening: family hx of breast and/or ovarian cancer and/or metastatic prostate cancer? 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; she did not have f/u pap smears after her hysterectomy; it was "way back" at age 21, 26 years ago; they told her she never had to had any more pap smears; had hysterectomy done  here Intimate partner violence: no abuse Contraception: n/a Osteoporosis: no steroids; did DEXA and barely osteopenic; getting calcium in the diet Fall prevention/vitamin D: discussed, some occasional sun exposure occasionally Immunizations: had old shingles vaccine Diet: fair eater Exercise: not enough Alcohol:    Office Visit from 10/02/2017 in [facility]  AUDIT-C Score  0     Tobacco use: nonsmoker AAA: n/a Aspirin: not taking Glucose:  Glucose  Date Value Ref Range Status  05/11/2015 97 65 - 99 mg/dL Final  11/09/2014 107 (H) 65 - 99 mg/dL Final   Glucose, Bld  Date Value Ref Range Status  07/18/2017 98 65 - 139 mg/dL Final    Comment:    .        Non-fasting reference interval .   11/14/2015 103 (H) 65 - 99 mg/dL Final   Lipids:  Lab Results  Component Value Date   CHOL 131 07/18/2017   CHOL 136 04/10/2017   CHOL 141 08/26/2016   Lab Results  Component Value Date   HDL 38 (L) 07/18/2017   HDL 37 (L) 04/10/2017   HDL 41 (L) 08/26/2016   Lab Results  Component Value Date   LDLCALC 73 07/18/2017   LDLCALC 79 04/10/2017   [place] 74 08/26/2016  Lab Results  Component Value Date   TRIG 115 07/18/2017   TRIG 113 04/10/2017   TRIG 129 08/26/2016   Lab Results  Component Value Date   CHOLHDL 3.4 07/18/2017   CHOLHDL 3.7 04/10/2017   CHOLHDL 3.4 08/26/2016   No results found for: LDLDIRECT   Past Medical History:  Diagnosis Date  . Allergy   . Hyperlipidemia   . Osteopenia 08/19/2017   Past Surgical History:  Procedure Laterality Date  . ABDOMINAL HYSTERECTOMY  12/30/2006  . CHOLECYSTECTOMY    . OOPHORECTOMY     Family History  Problem Relation Age of Onset  . Colon polyps Mother   . Diabetes Father   . Alcohol abuse Father   . Breast cancer Neg Hx    Social History   Tobacco Use  . Smoking status: Never Smoker  . Smokeless tobacco: Never Used  Substance Use Topics  . Alcohol use: No    Alcohol/week: 0.0 oz  .  Drug use: No   Review of Systems  Objective:   Vitals:   10/02/17 1022  BP: 122/80  Pulse: 77  Resp: 12  Temp: 98 F (36.7 C)  TempSrc: Oral  SpO2: 96%  Weight: 176 lb 8 oz (80.1 kg)  Height: _0  (1.626 m)   Body mass index is 30.3 kg/m. Wt Readings from Last 3 Encounters:  10/02/17 176 lb 8 oz (80.1 kg)  07/18/17 174 lb 6.4 oz (79.1 kg)  04/10/17 173 lb 4.8 oz (78.6 kg)   Physical Exam  Constitutional: She appears well-developed and well-nourished.  HENT:  Head: Normocephalic and atraumatic.  Right Ear: Hearing, tympanic membrane, external ear and ear canal normal.  Left Ear: Hearing, tympanic membrane, external ear and ear canal normal.  Eyes: Conjunctivae and EOM are normal. Right eye exhibits no hordeolum. Left eye exhibits no hordeolum. No scleral icterus.  Neck: Carotid bruit is not present. No thyromegaly present.  Cardiovascular: Normal rate, regular rhythm, S1 normal, S2 normal and normal heart sounds.  No extrasystoles are present.  Pulmonary/Chest: Effort normal and breath sounds normal. No respiratory distress.  Abdominal: Soft. Normal appearance and bowel sounds are normal. She exhibits no distension, no abdominal bruit, no pulsatile midline mass and no mass. There is no hepatosplenomegaly. There is no tenderness. No hernia.  Musculoskeletal: Normal range of motion. She exhibits no edema.  Lymphadenopathy:       Head (right side): No submandibular adenopathy present.       Head (left side): No submandibular adenopathy present.    She has no cervical adenopathy.    She has no axillary adenopathy.  Neurological: She is alert. She displays no tremor. No cranial nerve deficit. She exhibits normal muscle tone. Gait normal.  Reflex Scores:      Patellar reflexes are 2+ on the right side and 2+ on the left side. Skin: Skin is warm and dry. No bruising and no ecchymosis noted. No cyanosis. No pallor.     Scratch marks on right side chin (from dog); healing  superficial scabs on right forearm (grease splatter, burn injury); open comedone upper left side chest  Psychiatric: Her speech is normal and behavior is normal. Thought content normal. Her mood appears not anxious. She does not exhibit a depressed mood.    Assessment/Plan:   Problem List Items Addressed This Visit      Other   Well woman exam without gynecological exam    USPSTF grade A and B recommendations reviewed with patient; age-appropriate recommendations,  preventive care, screening tests, etc discussed and encouraged; healthy living encouraged; see AVS for patient education given to patient Will request records from her pap smear and hysterectomy from 12 years ago, if available          No orders of the defined types were placed in this encounter.  No orders of the defined types were placed in this encounter.   Follow up plan: Return in about 1 year (around 10/03/2018) for complete physical; on or after October 13th for visit and labs.  An After Visit Summary was printed and given to the patient.

## 2017-12-26 LAB — HM COLONOSCOPY

## 2018-01-21 ENCOUNTER — Ambulatory Visit: Payer: BLUE CROSS/BLUE SHIELD | Admitting: Nurse Practitioner

## 2018-02-02 ENCOUNTER — Ambulatory Visit: Payer: BLUE CROSS/BLUE SHIELD | Admitting: Family Medicine

## 2018-02-03 ENCOUNTER — Ambulatory Visit (INDEPENDENT_AMBULATORY_CARE_PROVIDER_SITE_OTHER): Payer: BLUE CROSS/BLUE SHIELD | Admitting: Nurse Practitioner

## 2018-02-03 ENCOUNTER — Ambulatory Visit: Payer: BLUE CROSS/BLUE SHIELD | Admitting: Family Medicine

## 2018-02-03 ENCOUNTER — Encounter: Payer: Self-pay | Admitting: Nurse Practitioner

## 2018-02-03 VITALS — BP 122/74 | HR 94 | Temp 97.9°F | Resp 12 | Ht 64.0 in | Wt 172.3 lb

## 2018-02-03 DIAGNOSIS — Z23 Encounter for immunization: Secondary | ICD-10-CM | POA: Diagnosis not present

## 2018-02-03 DIAGNOSIS — E782 Mixed hyperlipidemia: Secondary | ICD-10-CM | POA: Diagnosis not present

## 2018-02-03 DIAGNOSIS — M8588 Other specified disorders of bone density and structure, other site: Secondary | ICD-10-CM

## 2018-02-03 DIAGNOSIS — N951 Menopausal and female climacteric states: Secondary | ICD-10-CM

## 2018-02-03 MED ORDER — ROSUVASTATIN CALCIUM 10 MG PO TABS: 10.0000 mg | ORAL_TABLET | Freq: Every day | ORAL | 1 refills | Status: DC

## 2018-02-03 MED ORDER — VENLAFAXINE HCL ER 75 MG PO CP24: 75.0000 mg | ORAL_CAPSULE | Freq: Every day | ORAL | 1 refills | Status: DC

## 2018-02-03 NOTE — Patient Instructions (Addendum)
-   Increasing dose of effexor to see if this helps with your hot flashes, if the are still unimproved in a few weeks please let us know and we can refer you or do further testing  _____________________ General recommendations for prevention of osteoporosis: avoid smoking and heavy alcohol consumption, do weight-bearing exercises regularly.  An optimal diet for bone health involves making sure you get enough protein and calories as well as plenty of calcium and vitamin D, which are essential in helping to maintain proper bone formation and density. Postmenopausal women should consume 1200 mg of calcium per day and 800 IU of Vitamin D per day (total of diet plus supplements). The main dietary sources of calcium include milk and other dairy products, such as cottage cheese, yogurt, and hard cheese, and green vegetables, such as kale and broccoli. Sunlight exposure is recommended if tolerated for 30 minutes 5 times a day.

## 2018-02-03 NOTE — Progress Notes (Signed)
Name: Tina Dunlap   MRN: [id number]    DOB: 1965/12/19   Date:02/03/2018       Progress Note  Subjective  Chief Complaint  Chief Complaint  Patient presents with  . Follow-up  . Hot Flashes    with sweats and anxiety    HPI  Hot Flashes: takes effexor 37.5mg  daily, states gets 2-3 episodes that wake her up at night and then maybe 6 episodes in the day. States medicine helped initially but not as much now. Episodes last for a couple of minutes.   Hyperlipidemia: takes crestor nightly, no missed doses. Denies chest pain, blurry vision  Lab Results  Component Value Date   CHOL 131 07/18/2017   HDL 38 (L) 07/18/2017   LDLCALC 73 07/18/2017   TRIG 115 07/18/2017   CHOLHDL 3.4 07/18/2017   Seasonal allergies: taking cetirizine, works well   Hyperglycemia- last a1c was 5.5 denies polyphagia, polyuria or polydipsia.   Patient Active Problem List   Diagnosis Date Noted  . Osteopenia 08/19/2017  . Early onset menopause 07/18/2017  . Medication monitoring encounter 07/18/2017  . Screening for breast cancer 10/01/2016  . Pain in left ankle 11/14/2015  . Well woman exam without gynecological exam 06/09/2015  . Screening for breast cancer 06/09/2015  . Hyperglycemia 05/11/2015  . Hyperlipidemia 11/08/2014  . Hot flash, menopausal 11/08/2014  . Family history of diabetes mellitus in father 11/08/2014  . Right shoulder pain 11/08/2014    Past Medical History:  Diagnosis Date  . Allergy   . Hyperlipidemia   . Osteopenia 08/19/2017    Past Surgical History:  Procedure Laterality Date  . ABDOMINAL HYSTERECTOMY  12/30/2006  . CHOLECYSTECTOMY    . OOPHORECTOMY      Social History   Tobacco Use  . Smoking status: Never Smoker  . Smokeless tobacco: Never Used  Substance Use Topics  . Alcohol use: No    Alcohol/week: 0.0 standard drinks     Current Outpatient Medications:  .  cetirizine (ZYRTEC) 10 MG tablet, Take 10 mg by mouth daily. , Disp: , Rfl:  .  Multiple  Vitamin (MULTI-VITAMINS) TABS, Take 1 tablet by mouth daily. , Disp: , Rfl:  .  rosuvastatin (CRESTOR) 10 MG tablet, Take 1 tablet (10 mg total) by mouth at bedtime., Disp: 90 tablet, Rfl: 1 .  venlafaxine XR (EFFEXOR XR) 37.5 MG 24 hr capsule, Take 1 capsule (37.5 mg total) by mouth daily with breakfast., Disp: 90 capsule, Rfl: 3  Allergies  Allergen Reactions  . Latex Other (See Comments) and Rash    ROS   No other specific complaints in a complete review of systems (except as listed in HPI above).  Objective  Vitals:   02/03/18 1449  BP: 122/74  Pulse: 94  Resp: 12  Temp: 97.9 F (36.6 C)  TempSrc: Oral  SpO2: 98%  Weight: 172 lb 4.8 oz (78.2 kg)  Height: 5\' 4"  (1.626 m)     Body mass index is 29.58 kg/m.  Nursing Note and Vital Signs reviewed.  Physical Exam  Constitutional: She is oriented to person, place, and time. She appears well-developed and well-nourished. She is cooperative.  HENT:  Head: Normocephalic and atraumatic.  Right Ear: Hearing normal.  Left Ear: Hearing normal.  Mouth/Throat: Mucous membranes are normal.  Eyes: Conjunctivae are normal.  Neck: Normal range of motion. Neck supple. Carotid bruit is not present.  Cardiovascular: Normal rate, regular rhythm, normal heart sounds and intact distal pulses.  Pulmonary/Chest: Effort normal  and breath sounds normal.  Abdominal: Soft. Normal appearance and bowel sounds are normal. There is no tenderness. There is no CVA tenderness.  Musculoskeletal: Normal range of motion.  Neurological: She is alert and oriented to person, place, and time. She has normal strength. No sensory deficit. Coordination normal. GCS eye subscore is 4. GCS verbal subscore is 5. GCS motor subscore is 6.  Skin: Skin is warm, dry and intact. Capillary refill takes less than 2 seconds. She is not diaphoretic. No erythema.  Psychiatric: She has a normal mood and affect. Her speech is normal and behavior is normal. Judgment and thought  content normal.  Vitals reviewed.     No results found for this or any previous visit (from the past 48 hour(s)).  Assessment & Plan  1. Mixed hyperlipidemia - rosuvastatin (CRESTOR) 10 MG tablet; Take 1 tablet (10 mg total) by mouth at bedtime.  Dispense: 90 tablet; Refill: 1  2. Need for influenza vaccination - Flu Vaccine QUAD 6+ mos PF IM (Fluarix Quad PF)  3. Hot flash, menopausal - venlafaxine XR (EFFEXOR-XR) 75 MG 24 hr capsule; Take 1 capsule (75 mg total) by mouth daily with breakfast.  Dispense: 90 capsule; Refill: 1  4. Osteopenia of lumbar spine Discussed recommendations for prevention of osteoporosis

## 2018-07-21 ENCOUNTER — Other Ambulatory Visit: Payer: Self-pay | Admitting: Nurse Practitioner

## 2018-07-21 DIAGNOSIS — N951 Menopausal and female climacteric states: Secondary | ICD-10-CM

## 2018-08-05 ENCOUNTER — Ambulatory Visit: Payer: BLUE CROSS/BLUE SHIELD | Admitting: Family Medicine

## 2018-08-11 ENCOUNTER — Ambulatory Visit (INDEPENDENT_AMBULATORY_CARE_PROVIDER_SITE_OTHER): Payer: BLUE CROSS/BLUE SHIELD | Admitting: Nurse Practitioner

## 2018-08-11 ENCOUNTER — Encounter: Payer: Self-pay | Admitting: Nurse Practitioner

## 2018-08-11 VITALS — BP 129/88 | HR 81 | Temp 97.1°F | Ht 64.0 in | Wt 177.0 lb

## 2018-08-11 DIAGNOSIS — M8588 Other specified disorders of bone density and structure, other site: Secondary | ICD-10-CM

## 2018-08-11 DIAGNOSIS — E782 Mixed hyperlipidemia: Secondary | ICD-10-CM | POA: Diagnosis not present

## 2018-08-11 DIAGNOSIS — M62838 Other muscle spasm: Secondary | ICD-10-CM

## 2018-08-11 DIAGNOSIS — F411 Generalized anxiety disorder: Secondary | ICD-10-CM

## 2018-08-11 DIAGNOSIS — M545 Low back pain, unspecified: Secondary | ICD-10-CM

## 2018-08-11 DIAGNOSIS — K219 Gastro-esophageal reflux disease without esophagitis: Secondary | ICD-10-CM | POA: Diagnosis not present

## 2018-08-11 DIAGNOSIS — N951 Menopausal and female climacteric states: Secondary | ICD-10-CM

## 2018-08-11 MED ORDER — TIZANIDINE HCL 2 MG PO CAPS: 2.0000 mg | ORAL_CAPSULE | Freq: Three times a day (TID) | ORAL | 0 refills | Status: AC | PRN

## 2018-08-11 MED ORDER — ROSUVASTATIN CALCIUM 10 MG PO TABS: 10.0000 mg | ORAL_TABLET | Freq: Every day | ORAL | 1 refills | Status: DC

## 2018-08-11 MED ORDER — VENLAFAXINE HCL ER 150 MG PO CP24: 150.0000 mg | ORAL_CAPSULE | Freq: Every day | ORAL | 1 refills | Status: DC

## 2018-08-11 NOTE — Progress Notes (Signed)
Virtual Visit via Video Note  I connected with Tina Dunlap on 08/11/18 at 11:00 AM EDT by a video enabled telemedicine application and verified that I am speaking with the correct person using two identifiers.   Staff discussed the limitations of evaluation and management by telemedicine and the availability of in person appointments. The patient expressed understanding and agreed to proceed.  Patient location: home  My location: home office Other people present:  None,  HPI  Patient endorses lower back pain and some muscle soreness over the past few days from increased lifting and working. States feels a bit stiff. Hasn't tried anything OTC. No dysuria, paresthesias.   Hyperlipidemia rx rosuvastatin 10mg . Takes nightly without issues. Usually skips breakfast, maybe eats out for lunch, eats home-cooked meal at night time typically- steak and potatoes.  Lab Results  Component Value Date   CHOL 131 07/18/2017   HDL 38 (L) 07/18/2017   LDLCALC 73 07/18/2017   TRIG 115 07/18/2017   CHOLHDL 3.4 07/18/2017    Hot Flashes Venlafaxine 75mg  daily states takes if for hot flashes and occasionally anxiety- states its only momentary once a day but was having more initially states recently anxiety has increased again in the last few months.  GERD rx for protonix 40mg  daily; triggers tomato sauces and spicy stuff.   Osteopenia  Takes OTC calcium and vitamin D  Denies falls  PHQ2/9: Depression screen [facility] 2/9 08/11/2018 02/03/2018 02/03/2018 10/02/2017 04/10/2017  Decreased Interest 0 0 0 0 0  Down, Depressed, Hopeless 0 0 0 0 0  PHQ - 2 Score 0 0 0 0 0  Altered sleeping 0 1 - 0 -  Tired, decreased energy 0 1 - 2 -  Change in appetite 0 0 - 0 -  Feeling bad or failure about yourself  0 0 - 0 -  Trouble concentrating 0 1 - 3 -  Moving slowly or fidgety/restless 0 0 - 0 -  Suicidal thoughts 0 0 - 0 -  PHQ-9 Score 0 3 - 5 -  Difficult doing work/chores Not difficult at all Not difficult at all -  Not difficult at all -    PHQ reviewed. Negative   Patient Active Problem List   Diagnosis Date Noted  . Osteopenia 08/19/2017  . Early onset menopause 07/18/2017  . Medication monitoring encounter 07/18/2017  . Screening for breast cancer 10/01/2016  . Pain in left ankle 11/14/2015  . Well woman exam without gynecological exam 06/09/2015  . Screening for breast cancer 06/09/2015  . Hyperglycemia 05/11/2015  . Hyperlipidemia 11/08/2014  . Hot flash, menopausal 11/08/2014  . Family history of diabetes mellitus in father 11/08/2014  . Right shoulder pain 11/08/2014    Past Medical History:  Diagnosis Date  . Allergy   . Hyperlipidemia   . Osteopenia 08/19/2017    Past Surgical History:  Procedure Laterality Date  . ABDOMINAL HYSTERECTOMY  12/30/2006  . CHOLECYSTECTOMY    . OOPHORECTOMY      Social History   Tobacco Use  . Smoking status: Never Smoker  . Smokeless tobacco: Never Used  Substance Use Topics  . Alcohol use: No    Alcohol/week: 0.0 standard drinks     Current Outpatient Medications:  .  Calcium Carb-Cholecalciferol (CALCIUM PLUS VITAMIN D3 PO), Take by mouth., Disp: , Rfl:  .  cetirizine (ZYRTEC) 10 MG tablet, Take 10 mg by mouth daily. , Disp: , Rfl:  .  ELDERBERRY PO, Take by mouth., Disp: , Rfl:  .  Multiple Vitamin (MULTI-VITAMINS) TABS, Take 1 tablet by mouth daily. , Disp: , Rfl:  .  Multiple Vitamins-Minerals (EMERGEN-C IMMUNE PLUS/VIT D PO), Take by mouth., Disp: , Rfl:  .  pantoprazole (PROTONIX) 40 MG tablet, , Disp: , Rfl:  .  rosuvastatin (CRESTOR) 10 MG tablet, Take 1 tablet (10 mg total) by mouth at bedtime., Disp: 90 tablet, Rfl: 1 .  venlafaxine XR (EFFEXOR-XR) 75 MG 24 hr capsule, TAKE 1 CAPSULE (75 MG TOTAL) BY MOUTH DAILY WITH BREAKFAST., Disp: 90 capsule, Rfl: 1  Allergies  Allergen Reactions  . Latex Other (See Comments) and Rash    ROS   No other specific complaints in a complete review of systems (except as listed in HPI  above).  Objective  Vitals:   08/11/18 1057  BP: 129/88  Pulse: 81  Temp: (!) 97.1 F (36.2 C)  TempSrc: Oral  Weight: 177 lb (80.3 kg)  Height: 5\' 4"  (1.626 m)    Body mass index is 30.38 kg/m.  Nursing Note and Vital Signs reviewed.  Physical Exam  Constitutional: Patient appears well-developed and well-nourished. No distress.  HENT: Head: Normocephalic and atraumatic. Cardiovascular: Normal rate Pulmonary/Chest: Effort normal  Neurological: he is alert and oriented . speech  normal.  Skin: No rash noted. No erythema.  Psychiatric: Patient has a normal mood and affect. behavior is normal. Judgment and thought content normal.    Assessment & Plan  1. Osteopenia of lumbar spine Continue supplementation and weight bearing exercises  2. Mixed hyperlipidemia Discussed diet - rosuvastatin (CRESTOR) 10 MG tablet; Take 1 tablet (10 mg total) by mouth at bedtime.  Dispense: 90 tablet; Refill: 1  3. Hot flash, menopausal - venlafaxine XR (EFFEXOR-XR) 150 MG 24 hr capsule; Take 1 capsule (150 mg total) by mouth daily with breakfast.  Dispense: 90 capsule; Refill: 1  4. Gastroesophageal reflux disease without esophagitis PPI, avoid triggers.   5. Acute midline low back pain without sciatica Discussed OTC managment  6. Muscle spasm Heat, rest, stretching  - tizanidine (ZANAFLEX) 2 MG capsule; Take 1 capsule (2 mg total) by mouth 3 (three) times daily as needed for muscle spasms.  Dispense: 30 capsule; Refill: 0  7. GAD (generalized anxiety disorder) Increased dose per patient preference  - venlafaxine XR (EFFEXOR-XR) 150 MG 24 hr capsule; Take 1 capsule (150 mg total) by mouth daily with breakfast.  Dispense: 90 capsule; Refill: 1    Follow Up Instructions: 6 months Sooner if needed   I discussed the assessment and treatment plan with the patient. The patient was provided an opportunity to ask questions and all were answered. The patient agreed with the plan and  demonstrated an understanding of the instructions.   The patient was advised to call back or seek an in-person evaluation if the symptoms worsen or if the condition fails to improve as anticipated.  I provided 16 minutes of non-face-to-face time during this encounter.   Fredderick Severance, NP

## 2018-09-30 ENCOUNTER — Other Ambulatory Visit: Payer: Self-pay

## 2018-09-30 ENCOUNTER — Encounter: Payer: Self-pay | Admitting: Nurse Practitioner

## 2018-09-30 ENCOUNTER — Ambulatory Visit (INDEPENDENT_AMBULATORY_CARE_PROVIDER_SITE_OTHER): Payer: BLUE CROSS/BLUE SHIELD | Admitting: Nurse Practitioner

## 2018-09-30 VITALS — BP 112/82 | HR 80 | Temp 98.1°F | Ht 64.0 in | Wt 176.6 lb

## 2018-09-30 DIAGNOSIS — H9201 Otalgia, right ear: Secondary | ICD-10-CM

## 2018-09-30 DIAGNOSIS — K635 Polyp of colon: Secondary | ICD-10-CM | POA: Insufficient documentation

## 2018-09-30 DIAGNOSIS — J029 Acute pharyngitis, unspecified: Secondary | ICD-10-CM | POA: Diagnosis not present

## 2018-09-30 DIAGNOSIS — C4491 Basal cell carcinoma of skin, unspecified: Secondary | ICD-10-CM | POA: Insufficient documentation

## 2018-09-30 MED ORDER — AMOXICILLIN-POT CLAVULANATE 875-125 MG PO TABS: 1.0000 | ORAL_TABLET | Freq: Two times a day (BID) | ORAL | 0 refills | Status: DC

## 2018-09-30 MED ORDER — MAGIC MOUTHWASH W/LIDOCAINE: 5.0000 mL | Freq: Three times a day (TID) | ORAL | 0 refills | Status: DC | PRN

## 2018-09-30 NOTE — Progress Notes (Signed)
Virtual Visit via Video Note  I connected with Tina Dunlap on 09/30/18 at 10:40 AM EDT by a video enabled telemedicine application and verified that I am speaking with the correct person using two identifiers.   Staff discussed the limitations of evaluation and management by telemedicine and the availability of in person appointments. The patient expressed understanding and agreed to proceed.  Patient location: home  My location: work office Other people present: none HPI  Patient endorses sore throat started on Sunday. Monday was told to quarantine till July 2nd due to sore throat. Tuesday started to have right ear pain, fatigue and lower back pain. She thinks the lower back pain is because she has been sitting more. Very mild cough and sneezing- maybe once or twice a day. Endorses muffled sounds in right ear and right sided pain. Endorses watery eyes. Denies fevers, chills, shortness of breath, chest pain, nausea, vomiting, diarrhea, red or itchy eyes, dysuria, urinary frequency or urgency, vaginal discharge.  PHQ2/9: Depression screen [facility] 2/9 09/30/2018 08/11/2018 02/03/2018 02/03/2018 10/02/2017  Decreased Interest 0 0 0 0 0  Down, Depressed, Hopeless 0 0 0 0 0  PHQ - 2 Score 0 0 0 0 0  Altered sleeping 0 0 1 - 0  Tired, decreased energy 0 0 1 - 2  Change in appetite 0 0 0 - 0  Feeling bad or failure about yourself  0 0 0 - 0  Trouble concentrating 0 0 1 - 3  Moving slowly or fidgety/restless 0 0 0 - 0  Suicidal thoughts 0 0 0 - 0  PHQ-9 Score 0 0 3 - 5  Difficult doing work/chores Not difficult at all Not difficult at all Not difficult at all - Not difficult at all     PHQ reviewed. Negative  Patient Active Problem List   Diagnosis Date Noted  . Osteopenia 08/19/2017  . Early onset menopause 07/18/2017  . Medication monitoring encounter 07/18/2017  . Screening for breast cancer 10/01/2016  . Pain in left ankle 11/14/2015  . Well woman exam without gynecological exam 06/09/2015   . Screening for breast cancer 06/09/2015  . Hyperglycemia 05/11/2015  . Hyperlipidemia 11/08/2014  . Hot flash, menopausal 11/08/2014  . Family history of diabetes mellitus in father 11/08/2014  . Right shoulder pain 11/08/2014    Past Medical History:  Diagnosis Date  . Allergy   . Hyperlipidemia   . Osteopenia 08/19/2017    Past Surgical History:  Procedure Laterality Date  . ABDOMINAL HYSTERECTOMY  12/30/2006  . CHOLECYSTECTOMY    . OOPHORECTOMY      Social History   Tobacco Use  . Smoking status: Never Smoker  . Smokeless tobacco: Never Used  Substance Use Topics  . Alcohol use: No    Alcohol/week: 0.0 standard drinks     Current Outpatient Medications:  .  Calcium Carb-Cholecalciferol (CALCIUM PLUS VITAMIN D3 PO), Take by mouth., Disp: , Rfl:  .  cetirizine (ZYRTEC) 10 MG tablet, Take 10 mg by mouth daily. , Disp: , Rfl:  .  ELDERBERRY PO, Take by mouth., Disp: , Rfl:  .  Multiple Vitamin (MULTI-VITAMINS) TABS, Take 1 tablet by mouth daily. , Disp: , Rfl:  .  venlafaxine XR (EFFEXOR-XR) 150 MG 24 hr capsule, Take 1 capsule (150 mg total) by mouth daily with breakfast., Disp: 90 capsule, Rfl: 1 .  Multiple Vitamins-Minerals (EMERGEN-C IMMUNE PLUS/VIT D PO), Take by mouth., Disp: , Rfl:  .  pantoprazole (PROTONIX) 40 MG tablet, , Disp: ,  Rfl:  .  rosuvastatin (CRESTOR) 10 MG tablet, Take 1 tablet (10 mg total) by mouth at bedtime. (Patient not taking: Reported on 09/30/2018), Disp: 90 tablet, Rfl: 1 .  tizanidine (ZANAFLEX) 2 MG capsule, Take 1 capsule (2 mg total) by mouth 3 (three) times daily as needed for muscle spasms. (Patient not taking: Reported on 09/30/2018), Disp: 30 capsule, Rfl: 0  Allergies  Allergen Reactions  . Latex Other (See Comments) and Rash    ROS   No other specific complaints in a complete review of systems (except as listed in HPI above).  Objective  Vitals:   09/30/18 1028  BP: 112/82  Pulse: 80  Temp: 98.1 F (36.7 C)  TempSrc:  Oral  Weight: 176 lb 9.6 oz (80.1 kg)  Height: 5\' 4"  (1.626 m)     Body mass index is 30.31 kg/m.  Nursing Note and Vital Signs reviewed.  Physical Exam  Constitutional: Patient appears well-developed and well-nourished. No distress.  HENT: Head: Normocephalic and atraumatic. Conjunctive clear, right maxillary tenderness, mild bilateral frontal sinus tenderness, oropharynx red with exudate present.  Cardiovascular: Normal rate Pulmonary/Chest: Effort normal  Musculoskeletal: Normal range of motion,  Neurological: alert and oriented, speech normal.  Skin: No rash noted. No erythema.  Psychiatric: Patient has a normal mood and affect. behavior is normal. Judgment and thought content normal.    Assessment & Plan  1. Sore throat - amoxicillin-clavulanate (AUGMENTIN) 875-125 MG tablet; Take 1 tablet by mouth 2 (two) times daily.  Dispense: 20 tablet; Refill: 0 - magic mouthwash w/lidocaine SOLN; Take 5 mLs by mouth 3 (three) times daily as needed for mouth pain.  Dispense: 50 mL; Refill: 0  2. Right ear pain - amoxicillin-clavulanate (AUGMENTIN) 875-125 MG tablet; Take 1 tablet by mouth 2 (two) times daily.  Dispense: 20 tablet; Refill: 0   Still recommend self isolation   Follow Up Instructions:    I discussed the assessment and treatment plan with the patient. The patient was provided an opportunity to ask questions and all were answered. The patient agreed with the plan and demonstrated an understanding of the instructions.   The patient was advised to call back or seek an in-person evaluation if the symptoms worsen or if the condition fails to improve as anticipated.  I provided 14 minutes of non-face-to-face time during this encounter.   Fredderick Severance, NP

## 2018-10-03 ENCOUNTER — Other Ambulatory Visit: Payer: Self-pay | Admitting: Nurse Practitioner

## 2018-10-03 DIAGNOSIS — N951 Menopausal and female climacteric states: Secondary | ICD-10-CM

## 2018-10-03 DIAGNOSIS — F411 Generalized anxiety disorder: Secondary | ICD-10-CM

## 2018-10-05 ENCOUNTER — Encounter: Payer: Self-pay | Admitting: Nurse Practitioner

## 2018-10-05 ENCOUNTER — Other Ambulatory Visit: Payer: Self-pay

## 2018-10-05 ENCOUNTER — Ambulatory Visit (INDEPENDENT_AMBULATORY_CARE_PROVIDER_SITE_OTHER): Payer: BLUE CROSS/BLUE SHIELD | Admitting: Nurse Practitioner

## 2018-10-05 VITALS — BP 130/81 | HR 85 | Temp 97.9°F | Resp 16 | Wt 174.8 lb

## 2018-10-05 DIAGNOSIS — H9201 Otalgia, right ear: Secondary | ICD-10-CM | POA: Diagnosis not present

## 2018-10-05 DIAGNOSIS — J029 Acute pharyngitis, unspecified: Secondary | ICD-10-CM

## 2018-10-05 DIAGNOSIS — Z7189 Other specified counseling: Secondary | ICD-10-CM | POA: Diagnosis not present

## 2018-10-05 NOTE — Progress Notes (Signed)
Virtual Visit via Video Note  I connected with Tina Dunlap on 10/05/18 at 10:00 AM EDT by a video enabled telemedicine application and verified that I am speaking with the correct person using two identifiers.   Staff discussed the limitations of evaluation and management by telemedicine and the availability of in person appointments. The patient expressed understanding and agreed to proceed.  Patient location: home  My location: work office Other people present: none HPI  Patient presents for one week follow-up on right ear pain, sore throat and fatigue. Was started on Augmentin for ear infection and has been taking medications as prescribed.  Patient states sore throat and ear pain have resolved. Endorses mild fatigue and mild headache. States has not taken any tylenol or ibuprofen. Drinks coffee and coke, not water.   PHQ2/9: Depression screen [facility] 2/9 10/05/2018 09/30/2018 08/11/2018 02/03/2018 02/03/2018  Decreased Interest 0 0 0 0 0  Down, Depressed, Hopeless 0 0 0 0 0  PHQ - 2 Score 0 0 0 0 0  Altered sleeping 0 0 0 1 -  Tired, decreased energy 0 0 0 1 -  Change in appetite 0 0 0 0 -  Feeling bad or failure about yourself  0 0 0 0 -  Trouble concentrating 0 0 0 1 -  Moving slowly or fidgety/restless 0 0 0 0 -  Suicidal thoughts 0 0 0 0 -  PHQ-9 Score 0 0 0 3 -  Difficult doing work/chores Not difficult at all Not difficult at all Not difficult at all Not difficult at all -     PHQ reviewed. Negative  Patient Active Problem List   Diagnosis Date Noted  . Polyp of sigmoid colon 09/30/2018  . CA skin, basal cell 09/30/2018  . Osteopenia 08/19/2017  . Early onset menopause 07/18/2017  . Medication monitoring encounter 07/18/2017  . Screening for breast cancer 10/01/2016  . Well woman exam without gynecological exam 06/09/2015  . Screening for breast cancer 06/09/2015  . Hyperglycemia 05/11/2015  . Elevated cholesterol with elevated triglycerides 11/08/2014  . Hot flash,  menopausal 11/08/2014  . Family history of diabetes mellitus in father 11/08/2014    Past Medical History:  Diagnosis Date  . Allergy   . Hyperlipidemia   . Osteopenia 08/19/2017    Past Surgical History:  Procedure Laterality Date  . ABDOMINAL HYSTERECTOMY  12/30/2006  . CHOLECYSTECTOMY    . OOPHORECTOMY      Social History   Tobacco Use  . Smoking status: Never Smoker  . Smokeless tobacco: Never Used  Substance Use Topics  . Alcohol use: No    Alcohol/week: 0.0 standard drinks     Current Outpatient Medications:  .  amoxicillin-clavulanate (AUGMENTIN) 875-125 MG tablet, Take 1 tablet by mouth 2 (two) times daily., Disp: 20 tablet, Rfl: 0 .  Calcium Carb-Cholecalciferol (CALCIUM PLUS VITAMIN D3 PO), Take by mouth., Disp: , Rfl:  .  cetirizine (ZYRTEC) 10 MG tablet, Take 10 mg by mouth daily. , Disp: , Rfl:  .  ELDERBERRY PO, Take by mouth., Disp: , Rfl:  .  magic mouthwash w/lidocaine SOLN, Take 5 mLs by mouth 3 (three) times daily as needed for mouth pain., Disp: 50 mL, Rfl: 0 .  Multiple Vitamin (MULTI-VITAMINS) TABS, Take 1 tablet by mouth daily. , Disp: , Rfl:  .  Multiple Vitamins-Minerals (EMERGEN-C IMMUNE PLUS/VIT D PO), Take by mouth., Disp: , Rfl:  .  pantoprazole (PROTONIX) 40 MG tablet, , Disp: , Rfl:  .  rosuvastatin (CRESTOR)  10 MG tablet, Take 1 tablet (10 mg total) by mouth at bedtime., Disp: 90 tablet, Rfl: 1 .  tizanidine (ZANAFLEX) 2 MG capsule, Take 1 capsule (2 mg total) by mouth 3 (three) times daily as needed for muscle spasms., Disp: 30 capsule, Rfl: 0 .  venlafaxine XR (EFFEXOR-XR) 150 MG 24 hr capsule, TAKE 1 CAPSULE (150 MG TOTAL) BY MOUTH DAILY WITH BREAKFAST., Disp: 90 capsule, Rfl: 2  Allergies  Allergen Reactions  . Latex Other (See Comments) and Rash    ROS   No other specific complaints in a complete review of systems (except as listed in HPI above).  Objective  There were no vitals filed for this visit.   There is no height or  weight on file to calculate BMI.  Nursing Note and Vital Signs reviewed.  Physical Exam   Constitutional: Patient appears well-developed and well-nourished. No distress.  HENT: Head: Normocephalic and atraumatic. Pulmonary/Chest: Effort normal  Musculoskeletal: Normal range of motion,  Neurological: alert and oriented, speech normal.  Skin: No rash noted. No erythema.  Psychiatric: Patient has a normal mood and affect. behavior is normal. Judgment and thought content normal.    Assessment & Plan  1. Right ear pain Resolved, continue taking antibiotic  2. Sorethroat Resolved  3. Educated About Covid-19 Virus Infection Education provided.   Due to pandemic- recommended continued self isolation until 10 days of symptom onset as long as symptoms do not return or worsen and she maintains fever free for 3 days without meds.   Follow Up Instructions:   PRN- note to be mailed.  I discussed the assessment and treatment plan with the patient. The patient was provided an opportunity to ask questions and all were answered. The patient agreed with the plan and demonstrated an understanding of the instructions.   The patient was advised to call back or seek an in-person evaluation if the symptoms worsen or if the condition fails to improve as anticipated.  I provided 12 minutes of non-face-to-face time during this encounter.   Fredderick Severance, NP

## 2019-03-17 ENCOUNTER — Other Ambulatory Visit: Payer: Self-pay

## 2019-03-17 DIAGNOSIS — E782 Mixed hyperlipidemia: Secondary | ICD-10-CM

## 2019-03-22 ENCOUNTER — Other Ambulatory Visit: Payer: Self-pay

## 2019-03-22 DIAGNOSIS — E782 Mixed hyperlipidemia: Secondary | ICD-10-CM

## 2019-05-07 ENCOUNTER — Telehealth: Payer: Self-pay

## 2019-05-07 NOTE — Telephone Encounter (Signed)
Patient needs appt for med refills

## 2019-05-10 NOTE — Telephone Encounter (Signed)
Appt made

## 2019-05-18 ENCOUNTER — Other Ambulatory Visit: Payer: Self-pay

## 2019-05-18 ENCOUNTER — Ambulatory Visit (INDEPENDENT_AMBULATORY_CARE_PROVIDER_SITE_OTHER): Payer: BC Managed Care – PPO | Admitting: Family Medicine

## 2019-05-18 ENCOUNTER — Encounter: Payer: Self-pay | Admitting: Family Medicine

## 2019-05-18 DIAGNOSIS — M8588 Other specified disorders of bone density and structure, other site: Secondary | ICD-10-CM

## 2019-05-18 DIAGNOSIS — Z1231 Encounter for screening mammogram for malignant neoplasm of breast: Secondary | ICD-10-CM | POA: Diagnosis not present

## 2019-05-18 DIAGNOSIS — Z833 Family history of diabetes mellitus: Secondary | ICD-10-CM

## 2019-05-18 DIAGNOSIS — F411 Generalized anxiety disorder: Secondary | ICD-10-CM

## 2019-05-18 DIAGNOSIS — E782 Mixed hyperlipidemia: Secondary | ICD-10-CM

## 2019-05-18 DIAGNOSIS — Z114 Encounter for screening for human immunodeficiency virus [HIV]: Secondary | ICD-10-CM

## 2019-05-18 DIAGNOSIS — R739 Hyperglycemia, unspecified: Secondary | ICD-10-CM | POA: Diagnosis not present

## 2019-05-18 DIAGNOSIS — N951 Menopausal and female climacteric states: Secondary | ICD-10-CM

## 2019-05-18 MED ORDER — PAROXETINE HCL 10 MG PO TABS: ORAL_TABLET | ORAL | 1 refills | Status: DC

## 2019-05-18 MED ORDER — VENLAFAXINE HCL ER 75 MG PO CP24: 75.0000 mg | ORAL_CAPSULE | Freq: Every day | ORAL | 0 refills | Status: AC

## 2019-05-18 NOTE — Progress Notes (Signed)
Name: Tina Dunlap   MRN: [id number]    DOB: 07/21/1965   Date:05/18/2019       Progress Note  Subjective  Chief Complaint  Chief Complaint  Patient presents with  . Depression    follow up refill  . Hyperlipidemia    not taking medication in about a year    I connected with  Tina Dunlap  on 05/18/19 at  9:40 AM EST by a video enabled telemedicine application and verified that I am speaking with the correct person using two identifiers.  I discussed the limitations of evaluation and management by telemedicine and the availability of in person appointments. The patient expressed understanding and agreed to proceed. Staff also discussed with the patient that there may be a patient responsible charge related to this service. Patient Location: Home Provider Location: Home Office Additional Individuals present: None  HPI  Pt presents for follow up:  HLD: Was taking crestor, ran out, has not been in for routine follow up in over a year.  She is trying to eat lower fat, does not exercise.  Denies chest pain, shortness of breath, palpitations.  Osteopenia - Recent Fall: She slipped about a week ago on the ice - bruises on left elbow, left buttock and left thigh.  She notes stiffness and bruising are both improving. She declines any Xray or further eval at this time.  Taking calcium and vitamin D supplements.  Chronic Rhinitis: Was taking antihistamine daily, but stopped in December because she wanted to see how she did without it. She has ongoing nasal congestion at home, but is better when she goes to work.  Has 3 cats and a dog. She declines nasal sprays.  She will take zyrtec PRN.  Anxiety + Hot Flashes:  She is on Effexor 150mg  - states at first this worked well for her, but lately (over the last 6 months) it has not been working as well for her. She has not been on other medications in the past for anxiety.  She notes still having hot flashes multiple times a day.  Discussed switching  medications - Paxil does have current recommendation for Hot flash treatment, so we will switch to this today. Denies SI/HI, no panic attacks  Patient Active Problem List   Diagnosis Date Noted  . Polyp of sigmoid colon 09/30/2018  . CA skin, basal cell 09/30/2018  . Osteopenia 08/19/2017  . Early onset menopause 07/18/2017  . Medication monitoring encounter 07/18/2017  . Screening for breast cancer 10/01/2016  . Well woman exam without gynecological exam 06/09/2015  . Screening for breast cancer 06/09/2015  . Hyperglycemia 05/11/2015  . Elevated cholesterol with elevated triglycerides 11/08/2014  . Hot flash, menopausal 11/08/2014  . Family history of diabetes mellitus in father 11/08/2014    Past Surgical History:  Procedure Laterality Date  . ABDOMINAL HYSTERECTOMY  12/30/2006  . CHOLECYSTECTOMY    . OOPHORECTOMY      Family History  Problem Relation Age of Onset  . Colon polyps Mother   . Diabetes Father   . Alcohol abuse Father   . Breast cancer Neg Hx     Social History   Socioeconomic History  . Marital status: Married    Spouse name: Tina Dunlap  . Number of children: 3  . Years of education: Not on file  . Highest education level: High school graduate  Occupational History  . Not on file  Tobacco Use  . Smoking status: Never Smoker  . Smokeless  tobacco: Never Used  Substance and Sexual Activity  . Alcohol use: No    Alcohol/week: 0.0 standard drinks  . Drug use: No  . Sexual activity: Yes    Partners: Male  Other Topics Concern  . Not on file  Social History Narrative  . Not on file   Social Determinants of Health   Financial Resource Strain: Low Risk   . Difficulty of Paying Living Expenses: Not hard at all  Food Insecurity: No Food Insecurity  . Worried About Charity fundraiser in the Last Year: Never true  . Ran Out of Food in the Last Year: Never true  Transportation Needs: No Transportation Needs  . Lack of Transportation (Medical): No  .  Lack of Transportation (Non-Medical): No  Physical Activity: Insufficiently Active  . Days of Exercise per Week: 1 day  . Minutes of Exercise per Session: 60 min  Stress: No Stress Concern Present  . Feeling of Stress : Not at all  Social Connections: Somewhat Isolated  . Frequency of Communication with Friends and Family: More than three times a week  . Frequency of Social Gatherings with Friends and Family: Twice a week  . Attends Religious Services: Never  . Active Member of Clubs or Organizations: No  . Attends Archivist Meetings: Never  . Marital Status: Married  Human resources officer Violence: Not At Risk  . Fear of Current or Ex-Partner: No  . Emotionally Abused: No  . Physically Abused: No  . Sexually Abused: No     Current Outpatient Medications:  .  Calcium Carb-Cholecalciferol (CALCIUM PLUS VITAMIN D3 PO), Take by mouth., Disp: , Rfl:  .  cetirizine (ZYRTEC) 10 MG tablet, Take 10 mg by mouth daily. , Disp: , Rfl:  .  ELDERBERRY PO, Take by mouth., Disp: , Rfl:  .  Multiple Vitamin (MULTI-VITAMINS) TABS, Take 1 tablet by mouth daily. , Disp: , Rfl:  .  Multiple Vitamins-Minerals (EMERGEN-C IMMUNE PLUS/VIT D PO), Take by mouth., Disp: , Rfl:  .  tizanidine (ZANAFLEX) 2 MG capsule, Take 1 capsule (2 mg total) by mouth 3 (three) times daily as needed for muscle spasms., Disp: 30 capsule, Rfl: 0 .  venlafaxine XR (EFFEXOR-XR) 150 MG 24 hr capsule, TAKE 1 CAPSULE (150 MG TOTAL) BY MOUTH DAILY WITH BREAKFAST., Disp: 90 capsule, Rfl: 2 .  amoxicillin-clavulanate (AUGMENTIN) 875-125 MG tablet, Take 1 tablet by mouth 2 (two) times daily. (Patient not taking: Reported on 05/18/2019), Disp: 20 tablet, Rfl: 0 .  magic mouthwash w/lidocaine SOLN, Take 5 mLs by mouth 3 (three) times daily as needed for mouth pain. (Patient not taking: Reported on 05/18/2019), Disp: 50 mL, Rfl: 0 .  pantoprazole (PROTONIX) 40 MG tablet, , Disp: , Rfl:  .  rosuvastatin (CRESTOR) 10 MG tablet, Take 1  tablet (10 mg total) by mouth at bedtime. (Patient not taking: Reported on 05/18/2019), Disp: 90 tablet, Rfl: 1  Allergies  Allergen Reactions  . Latex Other (See Comments) and Rash    I personally reviewed active problem list, medication list, allergies, notes from last encounter, lab results with the patient/caregiver today.   ROS Constitutional: Negative for fever or weight change.  Respiratory: Negative for cough and shortness of breath.   Cardiovascular: Negative for chest pain or palpitations.  Gastrointestinal: Negative for abdominal pain, no bowel changes.  Musculoskeletal: Negative for gait problem or joint swelling.  Skin: Negative for rash.  Neurological: Negative for dizziness or headache.  No other specific complaints in a  complete review of systems (except as listed in HPI above).  Objective  Virtual encounter, vitals not obtained.  There is no height or weight on file to calculate BMI.  Physical Exam  Constitutional: Patient appears well-developed and well-nourished. No distress.  HENT: Head: Normocephalic and atraumatic.  Neck: Normal range of motion. Pulmonary/Chest: Effort normal. No respiratory distress. Speaking in complete sentences Neurological: Pt is alert and oriented to person, place, and time. Coordination, speech are normal.  Psychiatric: Patient has a normal mood and affect. behavior is normal. Judgment and thought content normal.  No results found for this or any previous visit (from the past 72 hour(s)).  PHQ2/9: Depression screen [facility] 2/9 05/18/2019 10/05/2018 09/30/2018 08/11/2018 02/03/2018  Decreased Interest 0 0 0 0 0  Down, Depressed, Hopeless 0 0 0 0 0  PHQ - 2 Score 0 0 0 0 0  Altered sleeping 1 0 0 0 1  Tired, decreased energy 1 0 0 0 1  Change in appetite 0 0 0 0 0  Feeling bad or failure about yourself  0 0 0 0 0  Trouble concentrating 0 0 0 0 1  Moving slowly or fidgety/restless 0 0 0 0 0  Suicidal thoughts 0 0 0 0 0  PHQ-9 Score 2 0 0  0 3  Difficult doing work/chores Not difficult at all Not difficult at all Not difficult at all Not difficult at all Not difficult at all  Some recent data might be hidden   PHQ-2/9 Result is negative.    Fall Risk: Fall Risk  05/18/2019 10/05/2018 09/30/2018 08/11/2018 02/03/2018  Falls in the past year? 0 0 0 0 No  Number falls in past yr: 0 0 0 0 -  Injury with Fall? 0 0 0 0 -  Comment - - - - -  Risk for fall due to : - - - - -  Risk for fall due to: Comment - - - - -  Follow up Falls evaluation completed - - - -    Assessment & Plan  1. Osteopenia of lumbar spine - Continue Calcium and Vit D supplementation - DG Bone Density; Future  2. Hyperglycemia - Discussed diet and need to start exercising regularly. - COMPLETE METABOLIC PANEL WITH GFR - Hemoglobin A1c  3. Encounter for screening mammogram for malignant neoplasm of breast - MM 3D SCREEN BREAST BILATERAL; Future  4. Elevated cholesterol with elevated triglycerides - Will check labs and restart Crestor if indicated.  Discussed diet and exercise in detail. - Lipid panel  5. Family history of diabetes mellitus in father - COMPLETE METABOLIC PANEL WITH GFR - Hemoglobin A1c  6. Hot flash, menopausal - Taper off of Venlafaxine, start Paxil. - venlafaxine XR (EFFEXOR XR) 75 MG 24 hr capsule; Take 1 capsule (75 mg total) by mouth daily with breakfast. After completion of this Rx, STOP medication.  Dispense: 7 capsule; Refill: 0 - PARoxetine (PAXIL) 10 MG tablet; Take 1/2 tablet once daily for 7 days, then increase to 1 tablet once daily.  Dispense: 30 tablet; Refill: 1  7. GAD (generalized anxiety disorder) - venlafaxine XR (EFFEXOR XR) 75 MG 24 hr capsule; Take 1 capsule (75 mg total) by mouth daily with breakfast. After completion of this Rx, STOP medication.  Dispense: 7 capsule; Refill: 0 - PARoxetine (PAXIL) 10 MG tablet; Take 1/2 tablet once daily for 7 days, then increase to 1 tablet once daily.  Dispense: 30 tablet;  Refill: 1  8. Screening for HIV without presence  of risk factors - HIV Antibody (routine testing w rflx)   I discussed the assessment and treatment plan with the patient. The patient was provided an opportunity to ask questions and all were answered. The patient agreed with the plan and demonstrated an understanding of the instructions.  The patient was advised to call back or seek an in-person evaluation if the symptoms worsen or if the condition fails to improve as anticipated.  I provided 21 minutes of non-face-to-face time during this encounter.

## 2019-05-20 ENCOUNTER — Other Ambulatory Visit: Payer: Self-pay | Admitting: Internal Medicine

## 2019-05-20 LAB — COMPLETE METABOLIC PANEL WITH GFR
AG Ratio: 1.7 (calc) (ref 1.0–2.5)
ALT: 40 U/L — ABNORMAL HIGH (ref 6–29)
AST: 23 U/L (ref 10–35)
Albumin: 4.4 g/dL (ref 3.6–5.1)
Alkaline phosphatase (APISO): 132 U/L (ref 37–153)
BUN: 19 mg/dL (ref 7–25)
CO2: 29 mmol/L (ref 20–32)
Calcium: 9.6 mg/dL (ref 8.6–10.4)
Chloride: 104 mmol/L (ref 98–110)
Creat: 0.62 mg/dL (ref 0.50–1.05)
GFR, Est African American: 118 mL/min/{1.73_m2} (ref >=60)
GFR, Est Non African American: 102 mL/min/{1.73_m2} (ref >=60)
Globulin: 2.6 g/dL (calc) (ref 1.9–3.7)
Glucose, Bld: 122 mg/dL — ABNORMAL HIGH (ref 65–99)
Potassium: 3.9 mmol/L (ref 3.5–5.3)
Sodium: 141 mmol/L (ref 135–146)
Total Bilirubin: 0.6 mg/dL (ref 0.2–1.2)
Total Protein: 7 g/dL (ref 6.1–8.1)

## 2019-05-20 LAB — LIPID PANEL
Cholesterol: 254 mg/dL — ABNORMAL HIGH (ref <200)
HDL: 45 mg/dL — ABNORMAL LOW (ref >=50)
LDL Cholesterol (Calc): 162 mg/dL (calc) — ABNORMAL HIGH
Non-HDL Cholesterol (Calc): 209 mg/dL (calc) — ABNORMAL HIGH (ref <130)
Total CHOL/HDL Ratio: 5.6 (calc) — ABNORMAL HIGH (ref <5.0)
Triglycerides: 284 mg/dL — ABNORMAL HIGH (ref <150)

## 2019-05-20 LAB — HEMOGLOBIN A1C
Hgb A1c MFr Bld: 5.6 % of total Hgb (ref <5.7)
Mean Plasma Glucose: 114 (calc)
eAG (mmol/L): 6.3 (calc)

## 2019-05-20 LAB — HIV ANTIBODY (ROUTINE TESTING W REFLEX): HIV 1&2 Ab, 4th Generation: NONREACTIVE

## 2019-05-20 MED ORDER — ROSUVASTATIN CALCIUM 10 MG PO TABS: 10.0000 mg | ORAL_TABLET | Freq: Every day | ORAL | 3 refills | Status: AC

## 2019-05-20 NOTE — Progress Notes (Signed)
Crestor renewed for patient.

## 2019-05-20 NOTE — Progress Notes (Signed)
Tina Dunlap,  Please share the lab results with the patient (not on Mychart). Her lipid panel was much worse again and Raquel Sarna noted she was not taking the Crestor product. She needs to resume taking and I will refill the medicine for her to do so. Also, diet modifications and increasing physical activity are important as Raquel Sarna noted.  Her glucose was slightly elevated, with her A1C ok at 5.6. Will continue to follow.  Her complete metabolic panel was otherwise all good, with only one liver test being just slightly out of range (the ALT - 40), and not a concern noting all other liver tests were normal.  We will continue to monitor.  Thanks [facility]

## 2019-07-15 ENCOUNTER — Other Ambulatory Visit: Payer: Self-pay | Admitting: Family Medicine

## 2019-07-15 DIAGNOSIS — N951 Menopausal and female climacteric states: Secondary | ICD-10-CM

## 2019-07-15 DIAGNOSIS — F411 Generalized anxiety disorder: Secondary | ICD-10-CM

## 2019-07-26 ENCOUNTER — Ambulatory Visit: Payer: BC Managed Care – PPO | Admitting: Family Medicine

## 2019-11-12 ENCOUNTER — Telehealth: Payer: Self-pay

## 2019-11-12 NOTE — Telephone Encounter (Signed)
lvm for scheduling °

## 2019-11-12 NOTE — Telephone Encounter (Signed)
Pt needs appt for refills

## 2020-03-27 ENCOUNTER — Other Ambulatory Visit: Payer: Self-pay | Admitting: Student

## 2020-03-27 DIAGNOSIS — Z1231 Encounter for screening mammogram for malignant neoplasm of breast: Secondary | ICD-10-CM

## 2020-04-06 ENCOUNTER — Other Ambulatory Visit: Payer: Self-pay

## 2020-04-06 ENCOUNTER — Ambulatory Visit
Admission: RE | Admit: 2020-04-06 | Discharge: 2020-04-06 | Disposition: A | Discharge status: Home / Self Care | Payer: BC Managed Care – PPO | Source: Ambulatory Visit | Attending: Student | Admitting: Student

## 2020-04-06 DIAGNOSIS — Z1231 Encounter for screening mammogram for malignant neoplasm of breast: Secondary | ICD-10-CM | POA: Diagnosis not present

## 2022-04-26 IMAGING — MG DIGITAL SCREENING BILAT W/ TOMO W/ CAD
6 of 10 series · 6 of 30 positions shown · non-contrast
Comparison: Previous exam(s).

CLINICAL DATA: Screening.

EXAM:
DIGITAL SCREENING BILATERAL MAMMOGRAM WITH TOMO AND CAD

[R CC synth-2D]
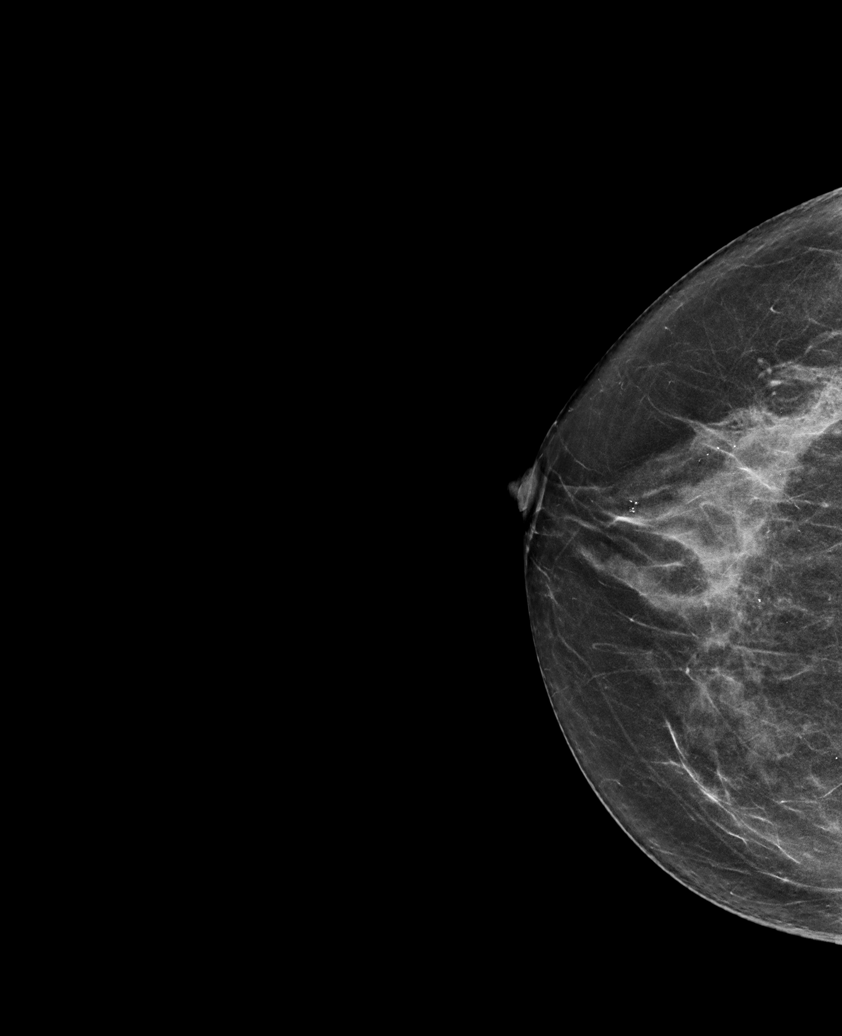

[R XCCL synth-2D]
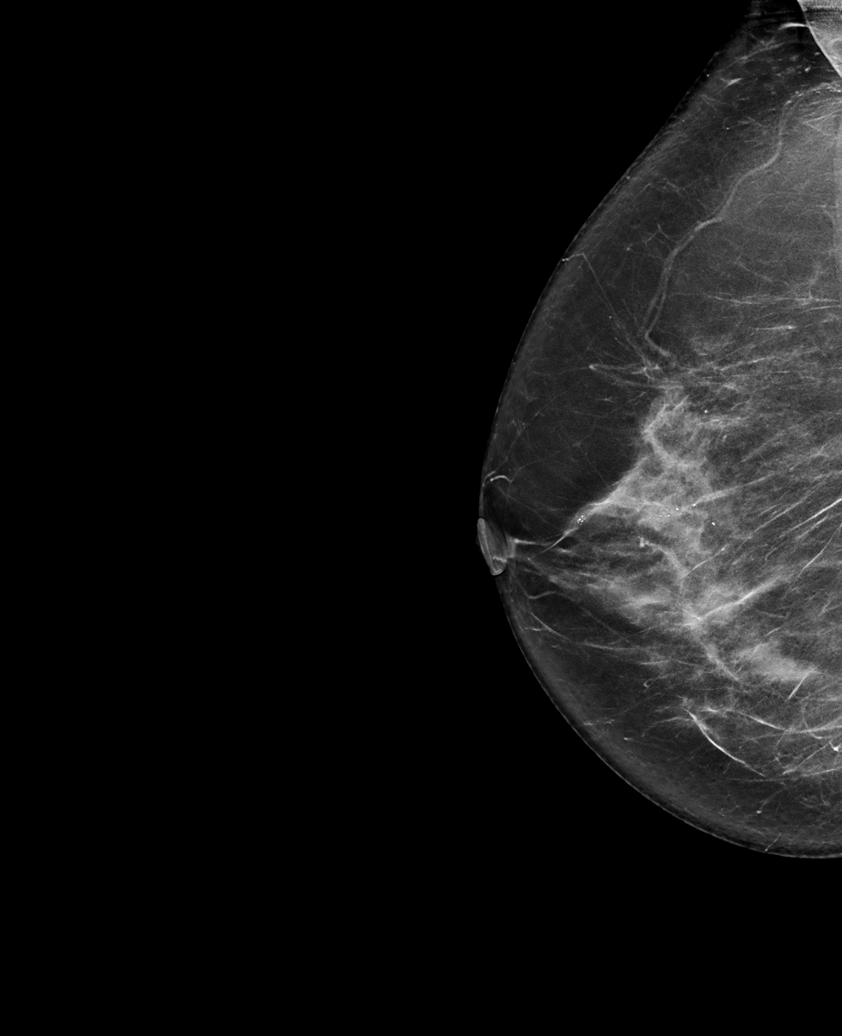

[L CC synth-2D]
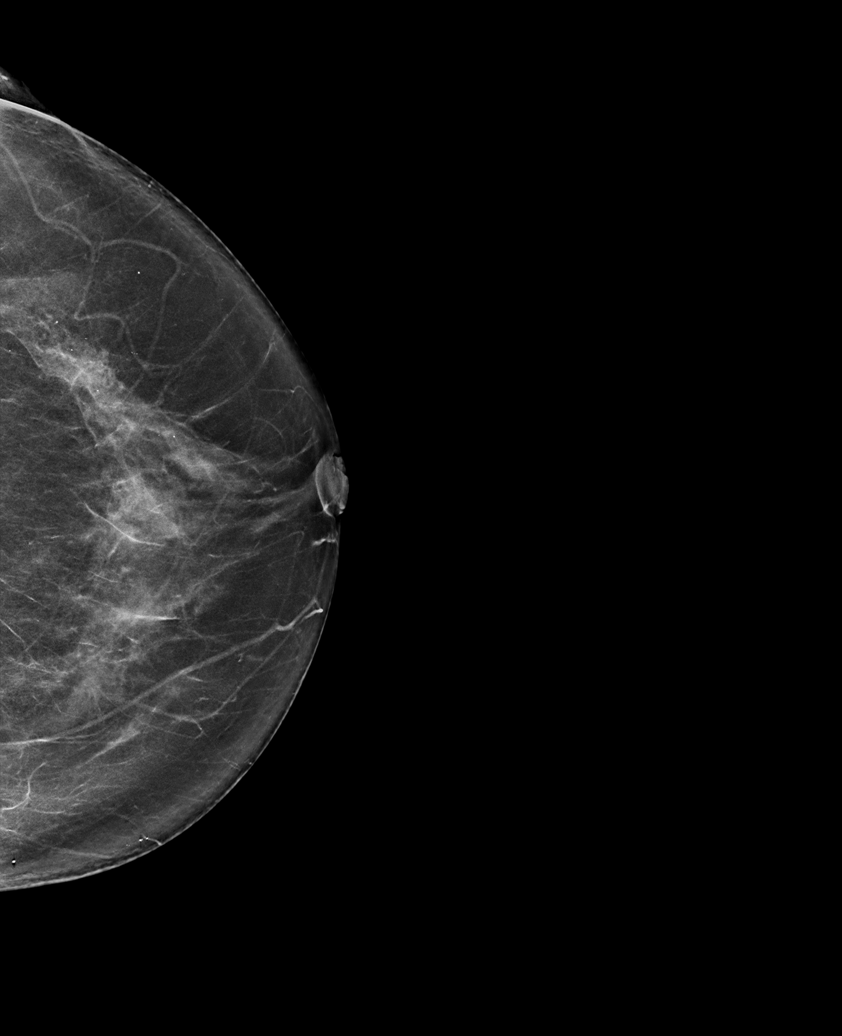

[L MLO synth-2D]
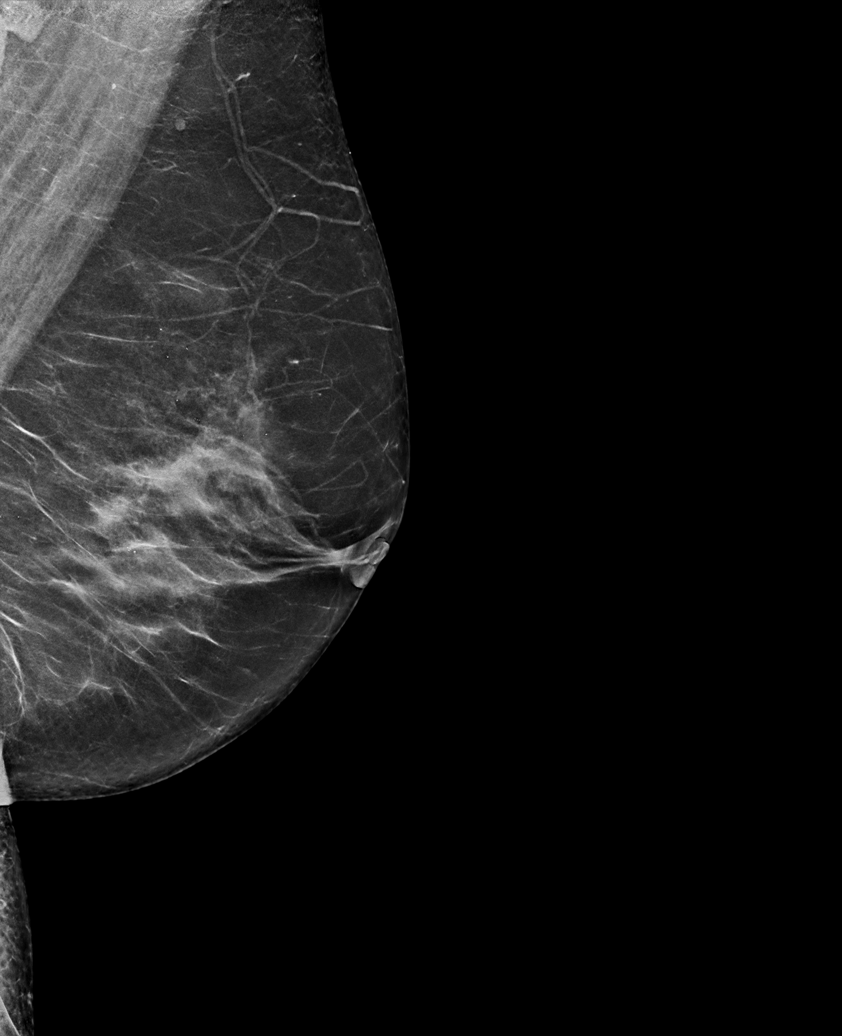

[R MLO synth-2D]
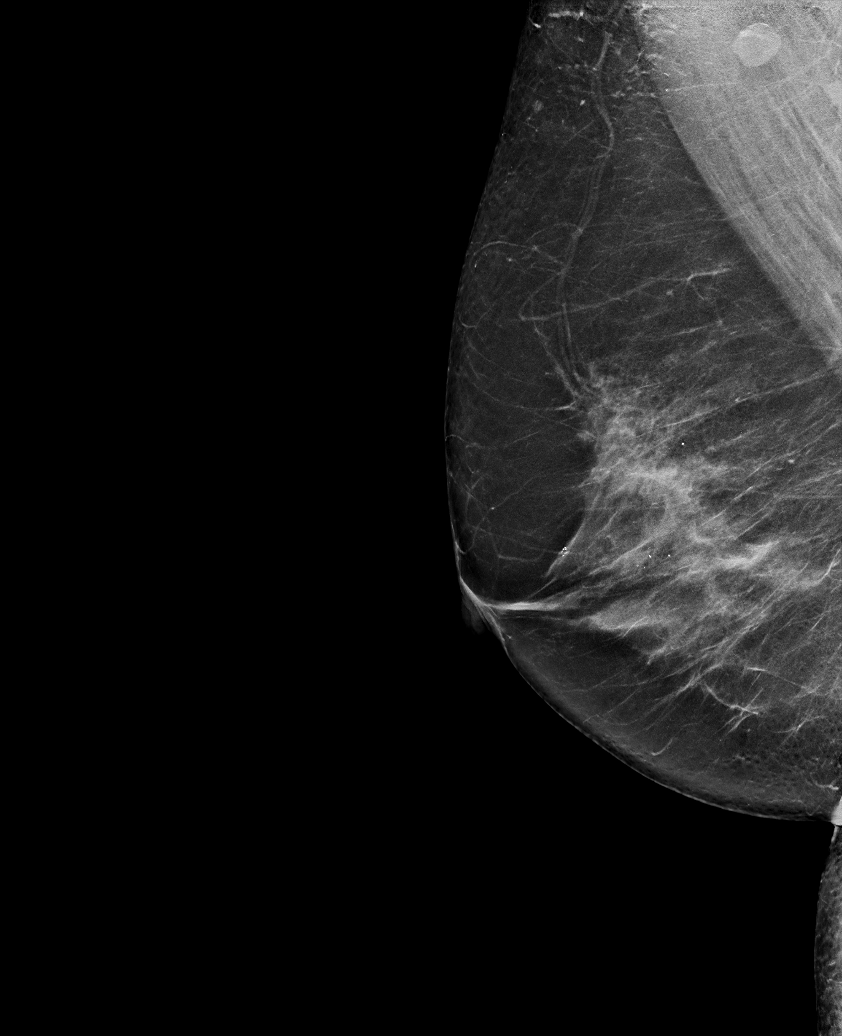

[L MLO tomo · tomo slice 35/70.0]
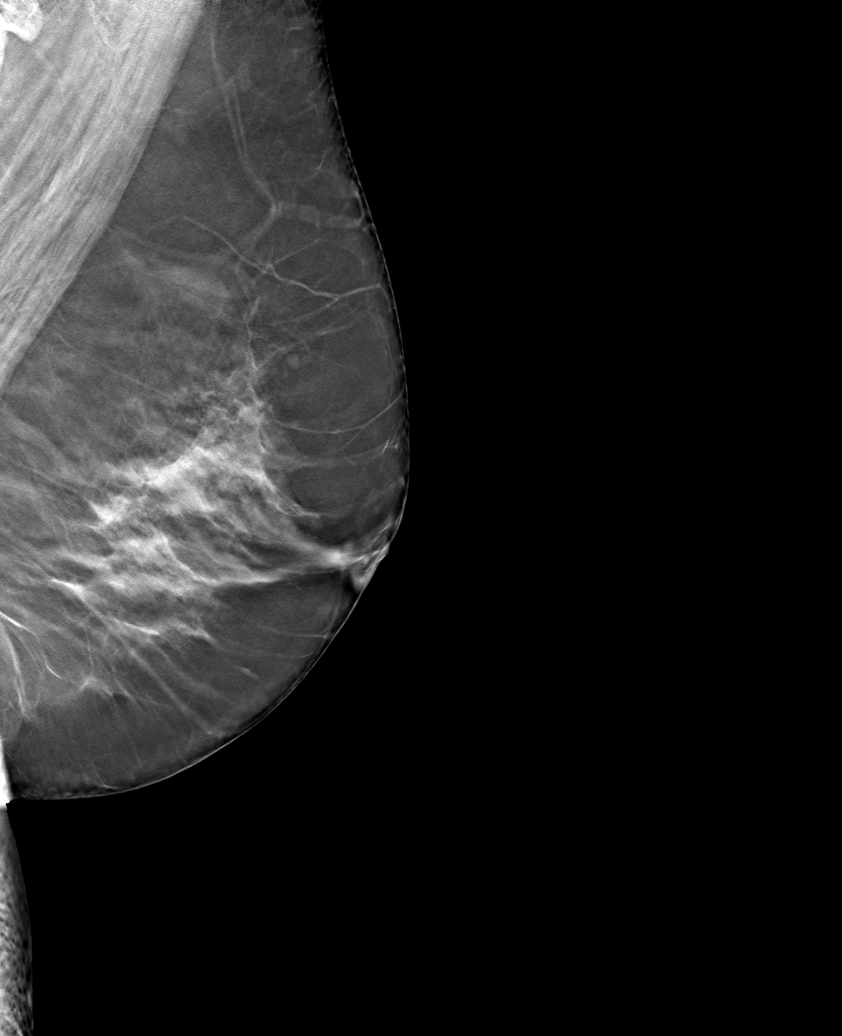

[6 of 30 positions shown; findings below may reference images not displayed]

ACR Breast Density Category c: The breast tissue is heterogeneously
dense, which may obscure small masses.
FINDINGS: There are no findings suspicious for malignancy. Images were
processed with CAD.
IMPRESSION: No mammographic evidence of malignancy. A result letter of this
screening mammogram will be mailed directly to the patient.

RECOMMENDATION:
Screening mammogram in one year. (Code:[id number]-U-LHB)

BI-RADS CATEGORY  1: Negative.

## 2023-11-27 ENCOUNTER — Other Ambulatory Visit: Payer: Self-pay | Admitting: Student

## 2023-11-27 DIAGNOSIS — Z1231 Encounter for screening mammogram for malignant neoplasm of breast: Secondary | ICD-10-CM
